# Patient Record
Sex: Male | Born: 1960 | Race: Black or African American | Hispanic: No | Marital: Married | Smoking: Never smoker
Health system: Southern US, Community
[De-identification: ages and names within clinical notes are randomized; demographics above are authoritative.]

## PROBLEM LIST (undated history)

## (undated) DIAGNOSIS — I10 Essential (primary) hypertension: Secondary | ICD-10-CM

---

## 1995-01-13 HISTORY — PX: KNEE SURGERY: SHX244

## 2001-05-02 ENCOUNTER — Encounter: Payer: Self-pay | Admitting: Internal Medicine

## 2001-05-02 ENCOUNTER — Encounter: Admission: RE | Admit: 2001-05-02 | Discharge: 2001-05-02 | Payer: Self-pay | Admitting: Internal Medicine

## 2001-06-29 ENCOUNTER — Encounter: Admission: RE | Admit: 2001-06-29 | Discharge: 2001-09-27 | Payer: Self-pay | Admitting: Internal Medicine

## 2011-09-23 ENCOUNTER — Other Ambulatory Visit: Payer: Self-pay

## 2011-09-23 ENCOUNTER — Encounter (HOSPITAL_COMMUNITY): Payer: Self-pay | Admitting: *Deleted

## 2011-09-23 ENCOUNTER — Emergency Department (HOSPITAL_COMMUNITY): Payer: Non-veteran care

## 2011-09-23 ENCOUNTER — Inpatient Hospital Stay (HOSPITAL_COMMUNITY)
Admission: EM | Admit: 2011-09-23 | Discharge: 2011-09-27 | DRG: 311 | Disposition: A | Discharge status: Home / Self Care | Payer: Non-veteran care | Attending: Internal Medicine | Admitting: Internal Medicine

## 2011-09-23 ENCOUNTER — Inpatient Hospital Stay (HOSPITAL_COMMUNITY): Payer: Non-veteran care

## 2011-09-23 DIAGNOSIS — Z79899 Other long term (current) drug therapy: Secondary | ICD-10-CM

## 2011-09-23 DIAGNOSIS — R079 Chest pain, unspecified: Secondary | ICD-10-CM

## 2011-09-23 DIAGNOSIS — G4440 Drug-induced headache, not elsewhere classified, not intractable: Secondary | ICD-10-CM | POA: Diagnosis not present

## 2011-09-23 DIAGNOSIS — T463X5A Adverse effect of coronary vasodilators, initial encounter: Secondary | ICD-10-CM | POA: Diagnosis not present

## 2011-09-23 DIAGNOSIS — R000 Tachycardia, unspecified: Secondary | ICD-10-CM | POA: Diagnosis present

## 2011-09-23 DIAGNOSIS — I10 Essential (primary) hypertension: Secondary | ICD-10-CM | POA: Diagnosis present

## 2011-09-23 DIAGNOSIS — R609 Edema, unspecified: Secondary | ICD-10-CM | POA: Diagnosis present

## 2011-09-23 DIAGNOSIS — R0989 Other specified symptoms and signs involving the circulatory and respiratory systems: Secondary | ICD-10-CM | POA: Diagnosis present

## 2011-09-23 DIAGNOSIS — Z8249 Family history of ischemic heart disease and other diseases of the circulatory system: Secondary | ICD-10-CM

## 2011-09-23 DIAGNOSIS — M25519 Pain in unspecified shoulder: Secondary | ICD-10-CM | POA: Diagnosis present

## 2011-09-23 DIAGNOSIS — I201 Angina pectoris with documented spasm: Principal | ICD-10-CM | POA: Diagnosis present

## 2011-09-23 DIAGNOSIS — I209 Angina pectoris, unspecified: Secondary | ICD-10-CM

## 2011-09-23 HISTORY — DX: Essential (primary) hypertension: I10

## 2011-09-23 LAB — CBC
Hemoglobin: 13 g/dL (ref 13.0–17.0)
MCHC: 32.7 g/dL (ref 30.0–36.0)
RDW: 14 % (ref 11.5–15.5)

## 2011-09-23 LAB — COMPREHENSIVE METABOLIC PANEL
ALT: 45 U/L (ref 0–53)
AST: 38 U/L — ABNORMAL HIGH (ref 0–37)
Albumin: 4.1 g/dL (ref 3.5–5.2)
Alkaline Phosphatase: 64 U/L (ref 39–117)
BUN: 11 mg/dL (ref 6–23)
CO2: 23 mEq/L (ref 19–32)
Calcium: 9.2 mg/dL (ref 8.4–10.5)
Chloride: 104 mEq/L (ref 96–112)
Creatinine, Ser: 0.97 mg/dL (ref 0.50–1.35)
GFR calc Af Amer: >90 mL/min (ref >90)
GFR calc non Af Amer: >90 mL/min (ref >90)
Glucose, Bld: 108 mg/dL — ABNORMAL HIGH (ref 70–99)
Potassium: 4 mEq/L (ref 3.5–5.1)
Sodium: 139 mEq/L (ref 135–145)
Total Bilirubin: 0.3 mg/dL (ref 0.3–1.2)
Total Protein: 7.4 g/dL (ref 6.0–8.3)

## 2011-09-23 LAB — LIPID PANEL
HDL: 82 mg/dL (ref >39)
LDL Cholesterol: 114 mg/dL — ABNORMAL HIGH (ref 0–99)
VLDL: 10 mg/dL (ref 0–40)

## 2011-09-23 LAB — PROTIME-INR
INR: 1.04 (ref 0.00–1.49)
Prothrombin Time: 13.8 seconds (ref 11.6–15.2)

## 2011-09-23 LAB — CREATININE, SERUM
Creatinine, Ser: 1.06 mg/dL (ref 0.50–1.35)
GFR calc non Af Amer: 80 mL/min — ABNORMAL LOW (ref >90)

## 2011-09-23 LAB — CBC WITH DIFFERENTIAL/PLATELET
Basophils Absolute: 0 10*3/uL (ref 0.0–0.1)
Basophils Relative: 1 % (ref 0–1)
Eosinophils Absolute: 0.1 10*3/uL (ref 0.0–0.7)
Eosinophils Relative: 1 % (ref 0–5)
HCT: 39.1 % (ref 39.0–52.0)
Hemoglobin: 13.1 g/dL (ref 13.0–17.0)
Lymphocytes Relative: 20 % (ref 12–46)
Lymphs Abs: 1.4 10*3/uL (ref 0.7–4.0)
MCH: 28.4 pg (ref 26.0–34.0)
MCHC: 33.5 g/dL (ref 30.0–36.0)
MCV: 84.6 fL (ref 78.0–100.0)
Monocytes Absolute: 0.5 10*3/uL (ref 0.1–1.0)
Monocytes Relative: 7 % (ref 3–12)
Neutro Abs: 5.2 10*3/uL (ref 1.7–7.7)
Neutrophils Relative %: 72 % (ref 43–77)
Platelets: 280 10*3/uL (ref 150–400)
RBC: 4.62 MIL/uL (ref 4.22–5.81)
RDW: 13.8 % (ref 11.5–15.5)
WBC: 7.2 10*3/uL (ref 4.0–10.5)

## 2011-09-23 LAB — TROPONIN I: Troponin I: <0.3 ng/mL (ref <0.30)

## 2011-09-23 LAB — D-DIMER, QUANTITATIVE: D-Dimer, Quant: 0.39 ug/mL-FEU (ref 0.00–0.48)

## 2011-09-23 MED ORDER — ONDANSETRON HCL 4 MG/2ML IJ SOLN
4.0000 mg | Freq: Once | INTRAMUSCULAR | Status: AC
  Administered 2011-09-23: 4 mg via INTRAVENOUS
  Filled 2011-09-23: qty 2

## 2011-09-23 MED ORDER — ASPIRIN 300 MG RE SUPP
300.0000 mg | RECTAL | Status: AC
  Filled 2011-09-23: qty 1

## 2011-09-23 MED ORDER — ASPIRIN 81 MG PO CHEW
324.0000 mg | CHEWABLE_TABLET | ORAL | Status: AC
  Administered 2011-09-23: 324 mg via ORAL
  Filled 2011-09-23: qty 4

## 2011-09-23 MED ORDER — GI COCKTAIL ~~LOC~~
30.0000 mL | Freq: Once | ORAL | Status: AC
  Administered 2011-09-23: 30 mL via ORAL
  Filled 2011-09-23: qty 30

## 2011-09-23 MED ORDER — HEPARIN SODIUM (PORCINE) 5000 UNIT/ML IJ SOLN
5000.0000 [IU] | Freq: Three times a day (TID) | INTRAMUSCULAR | Status: DC
  Administered 2011-09-24 – 2011-09-27 (×9): 5000 [IU] via SUBCUTANEOUS
  Filled 2011-09-23 (×14): qty 1

## 2011-09-23 MED ORDER — NITROGLYCERIN 0.4 MG SL SUBL
0.4000 mg | SUBLINGUAL_TABLET | SUBLINGUAL | Status: AC | PRN
  Administered 2011-09-23 (×3): 0.4 mg via SUBLINGUAL

## 2011-09-23 MED ORDER — IOHEXOL 350 MG/ML SOLN
100.0000 mL | Freq: Once | INTRAVENOUS | Status: AC | PRN
  Administered 2011-09-23: 100 mL via INTRAVENOUS

## 2011-09-23 MED ORDER — HYDROCODONE-ACETAMINOPHEN 5-325 MG PO TABS
1.0000 | ORAL_TABLET | Freq: Four times a day (QID) | ORAL | Status: DC | PRN
  Administered 2011-09-26: 2 via ORAL
  Filled 2011-09-23: qty 1
  Filled 2011-09-23: qty 2

## 2011-09-23 MED ORDER — ACETAMINOPHEN 325 MG PO TABS
650.0000 mg | ORAL_TABLET | ORAL | Status: DC | PRN
  Administered 2011-09-26: 650 mg via ORAL
  Filled 2011-09-23: qty 2

## 2011-09-23 MED ORDER — NITROGLYCERIN 0.4 MG SL SUBL
0.4000 mg | SUBLINGUAL_TABLET | SUBLINGUAL | Status: DC | PRN
  Administered 2011-09-23 – 2011-09-25 (×7): 0.4 mg via SUBLINGUAL
  Filled 2011-09-23 (×7): qty 25

## 2011-09-23 MED ORDER — ASPIRIN EC 81 MG PO TBEC
81.0000 mg | DELAYED_RELEASE_TABLET | Freq: Every day | ORAL | Status: DC
  Administered 2011-09-24 – 2011-09-27 (×4): 81 mg via ORAL
  Filled 2011-09-23 (×4): qty 1

## 2011-09-23 MED ORDER — MORPHINE SULFATE 10 MG/ML IJ SOLN
10.0000 mg | Freq: Once | INTRAMUSCULAR | Status: AC
  Administered 2011-09-23: 10 mg via INTRAMUSCULAR
  Filled 2011-09-23: qty 1

## 2011-09-23 MED ORDER — ONDANSETRON HCL 4 MG/2ML IJ SOLN
4.0000 mg | Freq: Four times a day (QID) | INTRAMUSCULAR | Status: DC | PRN
  Administered 2011-09-23 – 2011-09-27 (×2): 4 mg via INTRAVENOUS
  Filled 2011-09-23 (×2): qty 2

## 2011-09-23 NOTE — Progress Notes (Signed)
*  PRELIMINARY RESULTS* Vascular Ultrasound Lower extremity venous duplex has been completed.  Preliminary findings: bilaterally no evidence of DVT or baker's cyst.  Farrel Demark, RDMS, RVT  09/23/2011, 4:22 PM

## 2011-09-23 NOTE — ED Notes (Signed)
Report called to 2000 

## 2011-09-23 NOTE — ED Notes (Signed)
Patient is resting comfortably. 

## 2011-09-23 NOTE — ED Notes (Signed)
Pt was sitting a desk and developed midsternal chest pain with no radiation, no short of breath, does report diaphoresis

## 2011-09-23 NOTE — H&P (Signed)
Date: 09/23/2011               Patient Name:  Lance Mills MRN: [id number]  DOB: 1960-01-28 Age / Sex: 51 y.o., male   PCP: DEFAULT,PROVIDER              Medical Service: Internal Medicine Teaching Service              Attending Physician: Dr. Rocco Serene, MD    First Contact: Dr. Elenor Legato Pager: [phone]  Second Contact: Dr. Kristie Cowman Pager: [phone]            After Hours (After 5p/  First Contact Pager: [phone]  weekends / holidays): Second Contact Pager: [phone]     Chief Complaint: Chest pain  History of Present Illness: Patient is a 51 y.o. male with a PMHx of HTN, who presents to [facility] for evaluation of chest pain. The pain began at 930AM on the day of admission. It began suddenly as patient was standing talking with friends. Pain was located in the center of the chest, was stabbing in quality, with radiation of pressure-like pain to the back. Patient states the pain had been constant since that time and did not resolve until he received NTG in the ED, which he states temporarily resolved the pain, but after which it returned. He subsequently received morphine in the ED, which he states resolved his chest pain. Patient states he felt the pain was worse when his heart beats. He denies any other alleviating or exacerbating factors. No positional change in pain. States that his pain was accompanied by diaphoresis, but denies any SOB. Denies palpitations or leg swelling. Denies ever having chest pain in the past prior to this event. He denies heartburn or history of GERD.  Review of Systems: Constitutional:  denies fever, chills. Admits to diaphoresis.  Respiratory: denies SOB, wheezing.  Cardiovascular: admits to chest pain. Denies palpitations, leg swelling.  Gastrointestinal: admits to nausea and vomiting (only in ED, following morphine). Denies abdominal pain, heartburn, diarrhea, constipation, blood in stool.  Genitourinary: denies dysuria, urgency,  frequency, hematuria, flank pain and difficulty urinating.  Skin: denies pallor, rash and wound.    Current Outpatient Medications: Medication Sig Dispense Refill  . naproxen (NAPROSYN) 500 MG tablet Take 500 mg by mouth 2 (two) times daily with a meal.      . traMADol (ULTRAM) 50 MG tablet Take 50 mg by mouth every 6 (six) hours as needed. For pain       Allergies: Allergies  Allergen Reactions  . Amoxicillin   . Biaxin (Clarithromycin)   . Eggs Or Egg-Derived Products   . Other     Flu vaccine  . Penicillins      Past Medical History: History reviewed. No pertinent past medical history.  Past Surgical History: Past Surgical History  Procedure Date  . Knee surgery     Family History: No family history on file.  Social History: History   Social History  . Marital Status: Married    Spouse Name: N/A    Number of Children: N/A  . Years of Education: N/A   Occupational History  . Not on file.   Social History Main Topics  . Smoking status: Never Smoker   . Smokeless tobacco: Not on file  . Alcohol Use: No  . Drug Use: Yes    Special: Marijuana  . Sexually Active:    Other Topics Concern  . Not on file   Social  History Narrative  . No narrative on file     Vital Signs: Blood pressure 137/87, pulse 67, temperature 98.4 F (36.9 C), temperature source Oral, resp. rate 22, SpO2 96.00%.  Physical Exam: General: Vital signs reviewed and noted. Well-developed, well-nourished, in no acute distress; alert, appropriate and cooperative throughout examination.  Head: Normocephalic, atraumatic.  Eyes: PERRL, EOMI, No signs of anemia or jaundice.  Nose: Mucous membranes moist, not inflammed, nonerythematous.  Throat: Oropharynx nonerythematous, no exudate appreciated.   Neck: No deformities, masses, or tenderness noted.  Lungs:  Normal respiratory effort. Bibasilar rales.  Heart: RRR. S1 and S2 normal without gallop, murmur, or rubs.  Abdomen:  BS normoactive.  Soft, Nondistended, non-tender.  Extremities: Trace pretibial edema.  Neurologic: A&O X3, CN II - XII are grossly intact. Motor strength is 5/5 in the all 4 extremities, Sensations intact to light touch, Cerebellar signs negative.  Skin: No visible rashes, scars.   Lab results: Basic Metabolic Panel:  Basename 09/23/11 1103  NA 139  K 4.0  CL 104  CO2 23  GLUCOSE 108*  BUN 11  CREATININE 0.97  CALCIUM 9.2  MG --  PHOS --   Liver Function Tests:  Basename 09/23/11 1103  AST 38*  ALT 45  ALKPHOS 64  BILITOT 0.3  PROT 7.4  ALBUMIN 4.1   CBC:  Basename 09/23/11 1103  WBC 7.2  NEUTROABS 5.2  HGB 13.1  HCT 39.1  MCV 84.6  PLT 280   Cardiac Enzymes:  Basename 09/23/11 1318 09/23/11 1103  CKTOTAL -- --  CKMB -- --  CKMBINDEX -- --  TROPONINI <0.30 <0.30   D-Dimer:  Basename 09/23/11 1455  DDIMER 0.39   Imaging results:  Dg Chest 2 View  09/23/2011  *RADIOLOGY REPORT*  Clinical Data: Acute onset mid chest pain.  History of hypertension.  CHEST - 2 VIEW  Comparison: None.  Findings: Cardiac silhouette upper normal in size.  Thoracic aorta mildly tortuous.  Hilar and mediastinal contours otherwise unremarkable.  Lungs clear.  Bronchovascular markings normal. Pulmonary vascularity normal.  No pneumothorax.  No pleural effusions.  Visualized bony thorax intact.  IMPRESSION: No acute cardiopulmonary disease.   Original Report Authenticated By: Arnell Sieving, M.D.      Other results:  EKG (09/23/2011), 1300 - Normal Sinus Rhythm, regular rate of approximately 70 bpm, left axis, ST segments: normal. S1Q3T3 findings concerning for acute right heart strain. 1st degree AV block.     Assessment & Plan: Pt is a 51 y.o. yo male with a PMHx of HTN who was admitted on 09/23/2011 with symptoms of chest pain.   Chest pain - Etiology unclear. No ST changes on EKG, and troponin in ED at 1300 was negative, making ACS less likely. PE was a concern given patient's S1Q3T3 on  EKG, but CTA was negative for PE. With radiation of pain to back, aortic pathology is of concern. Aortic aneurysm ruled out by negative CTA. Aortic dissection not able to be completely r/o by CTA with PE protocol, but patient is hemodynamically stable and no obvious abnormalities were present on CTA. Patient denies sick contacts, has no fever or leukocytosis, and CXR unrevealing of infiltrate, so PNA unlikely. MSK pain is possible, however patient was not TTP and sudden onset with diaphoresis is less consistent with presentation of costosternal syndrome (e.g. costochondritis). Interventions at this time will be directed towards further excluding acute and concerning etiologies of chest pain, and towards treating the patient symptomatically. - admit to floor on  tele - monitor vitals - cycle CE x 3 - repeat EKG in AM - NTG PRN - daily ASA - GI cocktail - morphine   HTN - patient was on lisinopril previously, however he states his physician discontinued this medication as hypertension was thought to have resolved. BPs since admission as high as 160/100. Patient will need institution of anti-hypertensive regimen prior to discharge.  - consider initiating monotherapy versus dual therapy  DVT PPX - heparin    Signed: Elenor Legato, M.D. PGY-I, Internal Medicine Resident Pager: [phone] (7AM-5PM) 09/23/2011, 4:08 PM

## 2011-09-23 NOTE — ED Provider Notes (Signed)
History     CSN: [id number]  Arrival date & time 09/23/11  1037   First MD Initiated Contact with Patient 09/23/11 1126      Chief Complaint  Patient presents with  . 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 Describes the pain as a feeling of someone stabbing him in the chest. At first the pain was 7/10, but as he's been sitting in a hospital bed is decreased to a 5/10. He has not tried to take anything for his pain. Denies any radiation to his arm, jaw or back. He did not eat breakfast this morning. Denies any shortness of breath nausea, lightheadedness, dizziness, leg swelling, chest tenderness. He has a mild headache. He has never had chest pain like this before. Denies any history of indigestion or heart disease. His grandfather passed away at the age of 22 of a heart attack. He is a nonsmoker.  Patient is a 51 y.o. male presenting with chest pain. The history is provided by the patient and the spouse.  Chest Pain Pertinent negatives for primary symptoms include no shortness of breath, no cough, no nausea, no vomiting and no dizziness.  Pertinent negatives for associated symptoms include no diaphoresis.     History reviewed. No pertinent past medical history.  Past Surgical History  Procedure Date  . Knee surgery     No family history on file.  History  Substance Use Topics  . Smoking status: Never Smoker   . Smokeless tobacco: Not on file  . Alcohol Use: No      Review of Systems  Constitutional: Negative for diaphoresis, activity change and appetite change.  Respiratory: Negative for cough, chest tightness and shortness of breath.   Cardiovascular: Positive for chest pain. Negative for leg swelling.  Gastrointestinal: Negative for nausea and vomiting.    Musculoskeletal: Negative for back pain.  Skin: Negative for color change.  Neurological: Positive for headaches. Negative for dizziness and light-headedness.  Psychiatric/Behavioral: Negative for confusion.    Allergies  Amoxicillin; Biaxin; Eggs or egg-derived products; Other; and Penicillins  Home Medications   Current Outpatient Rx  Name Route Sig Dispense Refill  . NAPROXEN 500 MG PO TABS Oral Take 500 mg by mouth 2 (two) times daily with a meal.    . TRAMADOL HCL 50 MG PO TABS Oral Take 50 mg by mouth every 6 (six) hours as needed. For pain      BP 159/109  Pulse 80  Temp 98.4 F (36.9 C) (Oral)  Resp 18  SpO2 99%  Physical Exam  Nursing note and vitals reviewed. Constitutional: He is oriented to person, place, and time. He appears well-developed and well-nourished. No distress.  HENT:  Head: Normocephalic and atraumatic.  Mouth/Throat: Oropharynx is clear and moist.  Eyes: Conjunctivae normal are normal.  Neck: Normal range of motion. Neck supple. No JVD present.  Cardiovascular: Normal rate, regular rhythm, normal heart sounds and intact distal pulses.        Trace pitting edema lower extremities b/l  Pulmonary/Chest: Effort normal and breath sounds normal. He has no wheezes. He has no rales. He exhibits no tenderness.  Abdominal: Soft. Bowel sounds are normal. There is no tenderness.  Musculoskeletal: Normal range of motion.  Neurological: He is alert and oriented to person, place, and time.  Skin: Skin is warm and dry. He is not diaphoretic.  Psychiatric: He has a normal mood and affect. His behavior is normal.    ED Course  Procedures (including critical care time)   Labs Reviewed  CBC WITH DIFFERENTIAL  TROPONIN I  COMPREHENSIVE METABOLIC PANEL   Results for orders placed during the hospital encounter of 09/23/11  TROPONIN I      Component Value Range   Troponin I <0.30  <0.30 ng/mL  COMPREHENSIVE METABOLIC PANEL      Component Value Range    Sodium 139  135 - 145 mEq/L   Potassium 4.0  3.5 - 5.1 mEq/L   Chloride 104  96 - 112 mEq/L   CO2 23  19 - 32 mEq/L   Glucose, Bld 108 (*) 70 - 99 mg/dL   BUN 11  6 - 23 mg/dL   Creatinine, Ser 1.61  0.50 - 1.35 mg/dL   Calcium 9.2  8.4 - 09.6 mg/dL   Total Protein 7.4  6.0 - 8.3 g/dL   Albumin 4.1  3.5 - 5.2 g/dL   AST 38 (*) 0 - 37 U/L   ALT 45  0 - 53 U/L   Alkaline Phosphatase 64  39 - 117 U/L   Total Bilirubin 0.3  0.3 - 1.2 mg/dL   GFR calc non Af Amer >90  >90 mL/min   GFR calc Af Amer >90  >90 mL/min  CBC WITH DIFFERENTIAL      Component Value Range   WBC 7.2  4.0 - 10.5 K/uL   RBC 4.62  4.22 - 5.81 MIL/uL   Hemoglobin 13.1  13.0 - 17.0 g/dL   HCT 04.5  40.9 - 81.1 %   MCV 84.6  78.0 - 100.0 fL   MCH 28.4  26.0 - 34.0 pg   MCHC 33.5  30.0 - 36.0 g/dL   RDW 91.4  78.2 - 95.6 %   Platelets 280  150 - 400 K/uL   Neutrophils Relative 72  43 - 77 %   Neutro Abs 5.2  1.7 - 7.7 K/uL   Lymphocytes Relative 20  12 - 46 %   Lymphs Abs 1.4  0.7 - 4.0 K/uL   Monocytes Relative 7  3 - 12 %   Monocytes Absolute 0.5  0.1 - 1.0 K/uL   Eosinophils Relative 1  0 - 5 %   Eosinophils Absolute 0.1  0.0 - 0.7 K/uL   Basophils Relative 1  0 - 1 %   Basophils Absolute 0.0  0.0 - 0.1 K/uL    Dg Chest 2 View  09/23/2011  *RADIOLOGY REPORT*  Clinical Data: Acute onset mid chest pain.  History of hypertension.  CHEST - 2 VIEW  Comparison: None.  Findings: Cardiac silhouette upper normal in size.  Thoracic aorta mildly tortuous.  Hilar and mediastinal contours otherwise unremarkable.  Lungs clear.  Bronchovascular markings normal. Pulmonary vascularity normal.  No pneumothorax.  No pleural effusions.  Visualized bony thorax intact.  IMPRESSION: No acute cardiopulmonary disease.   Original Report Authenticated By: Arnell Sieving, M.D.     Date: 09/23/2011  Rate: 80  Rhythm: normal sinus rhythm  QRS Axis: left  Intervals: PR prolonged  ST/T Wave abnormalities: normal  Conduction  Disutrbances:none  Narrative Interpretation: no stemi  Old EKG Reviewed: none available  Another EKG obtained due to active chest pain  Date: 09/23/2011  Rate: 72  Rhythm: normal sinus rhythm  QRS Axis: normal  Intervals: PR prolonged  ST/T Wave abnormalities: normal  Conduction Disutrbances:first-degree A-V block   Narrative  Interpretation: no stemi  Old EKG Reviewed: no significant changes    No diagnosis found.    MDM  51 year old male without any significant past medical history presenting with sudden onset chest pain today. I was initially suspecting acid reflux, however patient failed GI cocktail. He now states the pain is worse and radiating to his back. I will give sublingual nitroglycerin and reevaluate. .1:10 PM Patient states his pain was relieved by nitroglycerin for a few seconds and then returned. Case discussed with Dr. Weldon Inches who also evaluated patient. Patient mentioned to Dr. Weldon Inches he was diaphoretic at the onset of chest pain. Due to the new onset unstable angina, we feel the next best step is to admit the patient.      Trevor Mace, PA-C 09/25/11 1610  Trevor Mace, PA-C 09/25/11 [phone]

## 2011-09-23 NOTE — ED Provider Notes (Signed)
Medical screening examination/treatment/procedure(s) were conducted as a shared visit with non-physician practitioner(s) and myself.  I personally evaluated the patient during the encounter 51 year old, male, with a history of hypertension, but no other past medical history.  He complains of sudden onset of chest pressure, and back pressure, associated with diaphoresis.  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  He denied nausea, vomiting, or shortness of breath.  He is never had this before.  He does not smoke.  One of his grandfathers had a myocardial infarction, but no closer relatives have had a history of coronary artery disease.  He was given nitroglycerin, and he had transient decrease in the severity of the symptoms, but his chest.  Pain, has returned.  His EKG, and cardiac enzymes do not show signs of ACS.  However, it is her greatest concern.  We will admit him to the hospital for further evaluation and treatment   Cheri Guppy, MD 09/23/11 1338

## 2011-09-24 DIAGNOSIS — I10 Essential (primary) hypertension: Secondary | ICD-10-CM

## 2011-09-24 DIAGNOSIS — R079 Chest pain, unspecified: Secondary | ICD-10-CM

## 2011-09-24 LAB — BASIC METABOLIC PANEL
CO2: 27 mEq/L (ref 19–32)
Calcium: 9.2 mg/dL (ref 8.4–10.5)
Potassium: 3.9 mEq/L (ref 3.5–5.1)
Sodium: 139 mEq/L (ref 135–145)

## 2011-09-24 LAB — CBC
MCH: 28.2 pg (ref 26.0–34.0)
Platelets: 273 10*3/uL (ref 150–400)
RBC: 4.65 MIL/uL (ref 4.22–5.81)
WBC: 7.2 10*3/uL (ref 4.0–10.5)

## 2011-09-24 LAB — TROPONIN I: Troponin I: <0.3 ng/mL (ref <0.30)

## 2011-09-24 LAB — CK TOTAL AND CKMB (NOT AT ARMC)
CK, MB: 3.6 ng/mL (ref 0.3–4.0)
Relative Index: 0.6 (ref 0.0–2.5)
Total CK: 624 U/L — ABNORMAL HIGH (ref 7–232)
Total CK: 494 U/L — ABNORMAL HIGH (ref 7–232)

## 2011-09-24 LAB — HEMOGLOBIN A1C: Hgb A1c MFr Bld: 5.8 % — ABNORMAL HIGH (ref <5.7)

## 2011-09-24 NOTE — Progress Notes (Signed)
Pt complained of chest pain.  Two sublingual nitroglycerin with relief.  Pt BP elevated 152/100, 149/89 and 140/81 with heart rate 67-78 throughout pain episode. EKG performed per verbal order. Pt pain free.  MD called and aware. Pt remains NPO awaiting stress test. Will continue to monitor. Thomas Hoff

## 2011-09-24 NOTE — H&P (Signed)
Internal Medicine Attending Admission Note Date: 09/24/2011  Patient name: Lance Mills Medical record number: [id number] Date of birth: February 20, 1960 Age: 51 y.o. Gender: male  I saw and evaluated the patient. I reviewed the resident's note and I agree with the resident's findings and plan as documented in the resident's note.  Chief Complaint(s): Chest pain  History - key components related to admission:  Lance Mills is a 51 year old man with a history of hypertension who presents with the acute onset of chest pain. 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 The pain radiated to the back but was not associated with shortness of breath. He eventually developed some nausea with pain but that wasn't until he arrived to the emergency department. After the pain developed, he walked down the hall to his office and sat down and it was improved. A few minutes later the pain returned and was even more severe. He therefore presented to the emergency department for further evaluation. He is active and rides his bike and walks. He was involved in a sprinting race earlier in the week with a younger gentleman and had no chest pain. In fact, he's never had previous chest pain. He also denies a history of acid reflux, a brackish taste in the back of his mouth, or any recent unusual activities, exercises, hobbies, or travel. He is without other complaints at this time.  It should be noted that his birthday was on Tuesday and he went out with friends to celebrate. He admits to drinking heavily and also using marijuana.  He does not have a history of diabetes, has never smoked cigarettes although smokes an occasional marijuana joint, does not have known hyperlipidemia, and does have a family history of premature coronary  artery disease and a second degree relative. His maternal grandfather died of an MI at the age of 38 and his maternal side cousin died of a myocardial infarction at the age of 34.  Physical Exam - key components related to admission:  Filed Vitals:   09/24/11 0532 09/24/11 0927 09/24/11 0956 09/24/11 1001  BP: 159/96 152/100 149/89 140/81  Pulse: 64 67 78 68  Temp: 97.9 F (36.6 C)     TempSrc: Oral     Resp: 18 18    SpO2: 99% 100% 98% 100%   General: Well-developed, well-nourished, man lying comfortably in bed in no acute distress. Lungs: Bibasilar inspiratory crackles without wheezes, rhonchi, or rales. Heart: Regular rate and rhythm with a 1/6 early systolic murmur at the left upper sternal border without rubs or gallops. Abdomen: Soft, nontender, active bowel sounds without masses or hepatosplenomegaly. Extremities: Without clubbing, cyanosis, or edema. Chest wall: Non-tender to palpation over the anterior chest.  Lab results:  Basic Metabolic Panel:  Basename 09/24/11 0640 09/23/11 1809 09/23/11 1103  NA 139 -- 139  K 3.9 -- 4.0  CL 101 -- 104  CO2 27 -- 23  GLUCOSE 89 -- 108*  BUN 13 -- 11  CREATININE 1.10 1.06 --  CALCIUM 9.2 -- 9.2  MG -- -- --  PHOS -- -- --   Liver Function Tests:  Basename 09/23/11 1103  AST 38*  ALT 45  ALKPHOS 64  BILITOT 0.3  PROT 7.4  ALBUMIN 4.1   CBC:  Basename 09/24/11 0640 09/23/11 1809 09/23/11 1103  WBC 7.2 7.7 --  NEUTROABS -- -- 5.2  HGB 13.1 13.0 --  HCT 39.6 39.7 --  MCV 85.2 85.4 --  PLT 273 257 --   Cardiac Enzymes:  Basename 09/24/11 0640 09/24/11 0031 09/23/11 1909  CKTOTAL 566* 624* --  CKMB 3.6 4.1* --  CKMBINDEX -- -- --  TROPONINI <0.30 <0.30 <0.30   D-Dimer:  Basename 09/23/11 1455  DDIMER 0.39   Hemoglobin A1C:  Basename 09/23/11 1809  HGBA1C 5.8*   Fasting Lipid Panel:  Basename 09/23/11 1809  CHOL 206*  HDL 82  LDLCALC 114*  TRIG 50  CHOLHDL 2.5  LDLDIRECT --    Coagulation:  Basename 09/23/11 1809  INR 1.04   Imaging results:  Dg Chest 2 View  09/23/2011  *RADIOLOGY REPORT*  Clinical Data: Acute onset mid chest pain.  History of hypertension.  CHEST - 2 VIEW  Comparison: None.  Findings: Cardiac silhouette upper normal in size.  Thoracic aorta mildly tortuous.  Hilar and mediastinal contours otherwise unremarkable.  Lungs clear.  Bronchovascular markings normal. Pulmonary vascularity normal.  No pneumothorax.  No pleural effusions.  Visualized bony thorax intact.  IMPRESSION: No acute cardiopulmonary disease.   Original Report Authenticated By: Arnell Sieving, M.D.    Ct Angio Chest Pe W/cm &/or Wo Cm  09/23/2011  *RADIOLOGY REPORT*  Clinical Data: Chest pain radiating to back.  Question pulmonary embolus.  Rule out aortic abnormality.  CT ANGIOGRAPHY CHEST  Technique:  Multidetector CT imaging of the chest using the standard protocol during bolus administration of intravenous contrast. Multiplanar reconstructed images including MIPs were obtained and reviewed to evaluate the vascular anatomy.  Contrast: OMNIPAQUE IOHEXOL 350 MG/ML SOLN  Comparison: None.  Findings: No filling defects in the pulmonary arteries to suggest pulmonary emboli.  Aorta is normal caliber.  The study was performed with pulmonary embolus bolus timing and the aorta is not opacified with contrast.  Cannot exclude dissection.  Heart is mildly enlarged. No mediastinal, hilar, or axillary adenopathy. Visualized thyroid and chest wall soft tissues unremarkable.  Minimal dependent atelectasis in the left lung.  Right lung is clear.  No effusions.  No acute bony abnormality.  Mild degenerative changes in the thoracic spine.  IMPRESSION: No evidence of pulmonary embolus. Timing of the study was not performed for adequate opacification and evaluation of the aorta. No aneurysm.  Cannot exclude dissection.  Borderline heart size.   Original Report Authenticated By: Cyndie Chime, M.D.     Other results:  EKG: Normal sinus rhythm, first degree AV block, no significant Q waves or LVH, normal axis, inferior limb lead III and aVF T wave inversion in a strain pattern. No comparisons available.  Assessment & Plan by Problem:  Mr. Barrell is a 51 year old man with a history of hypertension and marijuana abuse who presents with the acute onset of atypical chest pain. His only known risk factor is the hypertension, although he has second degree relatives with premature coronary artery disease. He has ruled out for myocardial infarction with serial enzymes. His elevated CK may be related to the alcohol binge during his birthday celebration the other day. Nonetheless, given the hypertension and family history, it is not unreasonable to risk stratify Mr. Portal from a cardiac standpoint.  1) Chest pain: Mr. Catala has been ruled out for myocardial infarction we will perform an exercise treadmill examination to risk stratify him. Although he has a knee replacement, he feels he is able to participate fully on the treadmill  stress test. Further evaluation and therapy are pending the results of this exam.  2) Hypertension: After the treadmill stress test we will consider initiating pharmacologic therapy for his hypertension. If the stress test is negative he may benefit from a medication such as hydrochlorothiazide. If the stress test is positive, he may benefit from the addition of an antianginal including a beta blocker.  3) Disposition: Depending on the results of the exercise treadmill Mr. Knebel may be ready for discharge if it is negative. If it is positive further evaluation will be necessary as an inpatient.

## 2011-09-24 NOTE — Progress Notes (Signed)
Subjective: Patient continues to complain of chest pain this AM, unchanged in quality (stabbing in mid-chest, with radiating pressure sensation to back). 4/10 in severity (improved). Improved with NTG within 1 minute, with relief ~1hr before pain recurs. Denies SOB. States he has had nausea/vomiting after PO intake, which he believes is a continuing effect from having received morphine yesterday evening.  Objective: Vital signs in last 24 hours: Filed Vitals:   09/24/11 0532 09/24/11 0927 09/24/11 0956 09/24/11 1001  BP: 159/96 152/100 149/89 140/81  Pulse: 64 67 78 68  Temp: 97.9 F (36.6 C)     TempSrc: Oral     Resp: 18 18    SpO2: 99% 100% 98% 100%   Weight change:   Intake/Output Summary (Last 24 hours) at 09/24/11 1238 Last data filed at 09/23/11 2253  Gross per 24 hour  Intake    720 ml  Output      0 ml  Net    720 ml   Physical Exam: General: Awake and alert; NAD CV: Regular rate and rhythm, normal S1/S2 Resp: Bibasilar rales, otherwise CTA. GI/Abd: Soft, non-tender, non-distended; normoactive bowel sounds Ext: trace edema, no clubbing or cyanosis Skin: Warm, dry, intact   Lab Results: Basic Metabolic Panel:  Lab 09/24/11 1610 09/23/11 1809 09/23/11 1103  NA 139 -- 139  K 3.9 -- 4.0  CL 101 -- 104  CO2 27 -- 23  GLUCOSE 89 -- 108*  BUN 13 -- 11  CREATININE 1.10 1.06 --  CALCIUM 9.2 -- 9.2  MG -- -- --  PHOS -- -- --   Liver Function Tests:  Lab 09/23/11 1103  AST 38*  ALT 45  ALKPHOS 64  BILITOT 0.3  PROT 7.4  ALBUMIN 4.1   CBC:  Lab 09/24/11 0640 09/23/11 1809 09/23/11 1103  WBC 7.2 7.7 --  NEUTROABS -- -- 5.2  HGB 13.1 13.0 --  HCT 39.6 39.7 --  MCV 85.2 85.4 --  PLT 273 257 --   Cardiac Enzymes:  Lab 09/24/11 0640 09/24/11 0031 09/23/11 1909  CKTOTAL 566* 624* --  CKMB 3.6 4.1* --  CKMBINDEX -- -- --  TROPONINI <0.30 <0.30 <0.30   D-Dimer:  Lab 09/23/11 1455  DDIMER 0.39   Hemoglobin A1C:  Lab 09/23/11 1809  HGBA1C 5.8*     Fasting Lipid Panel:  Lab 09/23/11 1809  CHOL 206*  HDL 82  LDLCALC 114*  TRIG 50  CHOLHDL 2.5  LDLDIRECT --   Coagulation:  Lab 09/23/11 1809  LABPROT 13.8  INR 1.04   Studies/Results: Dg Chest 2 View  09/23/2011  *RADIOLOGY REPORT*  Clinical Data: Acute onset mid chest pain.  History of hypertension.  CHEST - 2 VIEW  Comparison: None.  Findings: Cardiac silhouette upper normal in size.  Thoracic aorta mildly tortuous.  Hilar and mediastinal contours otherwise unremarkable.  Lungs clear.  Bronchovascular markings normal. Pulmonary vascularity normal.  No pneumothorax.  No pleural effusions.  Visualized bony thorax intact.  IMPRESSION: No acute cardiopulmonary disease.   Original Report Authenticated By: Arnell Sieving, M.D.    Ct Angio Chest Pe W/cm &/or Wo Cm  09/23/2011  *RADIOLOGY REPORT*  Clinical Data: Chest pain radiating to back.  Question pulmonary embolus.  Rule out aortic abnormality.  CT ANGIOGRAPHY CHEST  Technique:  Multidetector CT imaging of the chest using the standard protocol during bolus administration of intravenous contrast. Multiplanar reconstructed images including MIPs were obtained and reviewed to evaluate the vascular anatomy.  Contrast: OMNIPAQUE IOHEXOL  350 MG/ML SOLN  Comparison: None.  Findings: No filling defects in the pulmonary arteries to suggest pulmonary emboli.  Aorta is normal caliber.  The study was performed with pulmonary embolus bolus timing and the aorta is not opacified with contrast.  Cannot exclude dissection.  Heart is mildly enlarged. No mediastinal, hilar, or axillary adenopathy. Visualized thyroid and chest wall soft tissues unremarkable.  Minimal dependent atelectasis in the left lung.  Right lung is clear.  No effusions.  No acute bony abnormality.  Mild degenerative changes in the thoracic spine.  IMPRESSION: No evidence of pulmonary embolus. Timing of the study was not performed for adequate opacification and evaluation of the  aorta. No aneurysm.  Cannot exclude dissection.  Borderline heart size.   Original Report Authenticated By: Cyndie Chime, M.D.    Medications: I have reviewed the patient's current medications. Scheduled Meds:   . aspirin  324 mg Oral NOW   Or  . aspirin  300 mg Rectal NOW  . aspirin EC  81 mg Oral Daily  . heparin  5,000 Units Subcutaneous Q8H  .  morphine injection  10 mg Intramuscular Once  . ondansetron (ZOFRAN) IV  4 mg Intravenous Once   Continuous Infusions:  PRN Meds:.acetaminophen, HYDROcodone-acetaminophen, iohexol, nitroGLYCERIN, nitroGLYCERIN, ondansetron (ZOFRAN) IV Assessment/Plan: Chest pain - ACS ruled out by trop neg x 3 (CK mildly elevated, but unlikely to be related to ACS given negative troponins). EKG revealed pattern concerning for PE, however CTA was negative. Chest pain decreased but persisting this AM, with temporary responsiveness to NTG (responds within 1 minute, with relief ~1hr). 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 LDL = 114, which without evidence of CAD is already at goal without intervention given history. We will continue to investigate possible cardiac etiology, with outpatient follow-up for the issue if workup is negative.  - AM EKG given recurring CP - exercise stress test  HTN - BP mildly elevated during course thus far. Will likely institute anti-HTN therapy following exercise stress test. Patient did well with lisinopril until 05/2011 (with only mild hypotension), but had adverse reaction to other agents in more remote past.  - defer decision on anti-HTN regimen until results of stress ECG have returned, as results will dictate management decisions  Dispo - possible discharge if stress ECG is negative, with [facility].   LOS: 1 day   Elfredia Nevins 09/24/2011, 12:38 PM

## 2011-09-24 NOTE — Progress Notes (Signed)
Pt complained of chest pain more towards right side.  Pt states pain is shooting.  Pt given two sublingual NTG with relief.  EKG completed and in chart. Pt continues to have elevated BP and states he feels heat pounding. Heart rate remained stable 70-78 . BP 156/105, 144/110, and 137/94 before and after nitroglycerin.  Pt is currently pain free.  Will continue to monitor. Thomas Hoff

## 2011-09-24 NOTE — Progress Notes (Signed)
Pt complained of 5/10 chest pain. Pt given 1 sublingual nitro, V/S check and EKG obtained. EKG shows NSR with 1st degree heart block. Pts VSS. After receiving 1 nitro tablet pt states he is pain free. Pt told to call pt if he develops any more chest pain. Pt in bed with call bell in reach. Will continue to monitor and assess for chest pain. Steva Colder [facility] 09/23/2011

## 2011-09-25 DIAGNOSIS — R079 Chest pain, unspecified: Secondary | ICD-10-CM

## 2011-09-25 MED ORDER — HYDROCHLOROTHIAZIDE 25 MG PO TABS
25.0000 mg | ORAL_TABLET | Freq: Every day | ORAL | Status: DC
  Administered 2011-09-25 – 2011-09-27 (×3): 25 mg via ORAL
  Filled 2011-09-25 (×4): qty 1

## 2011-09-25 NOTE — Progress Notes (Addendum)
Pt had episode of chest pain 8/10 to 0/10.  Two sublingual nitro given. BP is documented in epic.  Pt given O2 to wear at 2 liters. sats 100 RA. Chest pain free at this time.  EKG in chart. Lance Mills

## 2011-09-25 NOTE — Progress Notes (Signed)
Subjective: Feeling better today. Denies any chest pain overnight, and states he last had pain at approximately 8PM yesterday evening which responded to NTG.   Patient denies any chest pain this AM. No SOB. Denies other complaints.   Objective: Vital signs in last 24 hours: Filed Vitals:   09/24/11 1351 09/24/11 1947 09/24/11 2111 09/25/11 0503  BP: 136/82 171/100  148/106  Pulse: 71 72  73  Temp: 98.2 F (36.8 C) 98.2 F (36.8 C)  98.6 F (37 C)  TempSrc: Oral Oral  Oral  Resp: 18 18  18   Height:  6\' 1"  (1.854 m)    Weight:   202 lb (91.627 kg)   SpO2: 100% 99%  100%   Weight change:   Intake/Output Summary (Last 24 hours) at 09/25/11 1005 Last data filed at 09/25/11 0505  Gross per 24 hour  Intake      0 ml  Output      3 ml  Net     -3 ml   Physical Exam: General: Awake and alert; NAD CV: Regular rate and rhythm; no murmurs, rubs, or gallops Resp: Clear to auscultation bilaterally; no wheezes,  no rales (interval improvement), or rhonchi GI/Abd: Soft, non-tender, non-distended; normoactive bowel sounds Ext: 2+ pulses in all extremities; no edema (interval improvement), clubbing, or cyanosis Skin: Warm, dry, intact   Lab Results: Basic Metabolic Panel:  Lab 09/24/11 1610 09/23/11 1809 09/23/11 1103  NA 139 -- 139  K 3.9 -- 4.0  CL 101 -- 104  CO2 27 -- 23  GLUCOSE 89 -- 108*  BUN 13 -- 11  CREATININE 1.10 1.06 --  CALCIUM 9.2 -- 9.2  MG -- -- --  PHOS -- -- --   Liver Function Tests:  Lab 09/23/11 1103  AST 38*  ALT 45  ALKPHOS 64  BILITOT 0.3  PROT 7.4  ALBUMIN 4.1   CBC:  Lab 09/24/11 0640 09/23/11 1809 09/23/11 1103  WBC 7.2 7.7 --  NEUTROABS -- -- 5.2  HGB 13.1 13.0 --  HCT 39.6 39.7 --  MCV 85.2 85.4 --  PLT 273 257 --   Cardiac Enzymes:  Lab 09/24/11 1409 09/24/11 0640 09/24/11 0031 09/23/11 1909  CKTOTAL 494* 566* 624* --  CKMB 3.2 3.6 4.1* --  CKMBINDEX -- -- -- --  TROPONINI -- <0.30 <0.30 <0.30   D-Dimer:  Lab 09/23/11  1455  DDIMER 0.39   Hemoglobin A1C:  Lab 09/23/11 1809  HGBA1C 5.8*   Fasting Lipid Panel:  Lab 09/23/11 1809  CHOL 206*  HDL 82  LDLCALC 114*  TRIG 50  CHOLHDL 2.5  LDLDIRECT --   Coagulation:  Lab 09/23/11 1809  LABPROT 13.8  INR 1.04   Studies/Results: Dg Chest 2 View  09/23/2011  *RADIOLOGY REPORT*  Clinical Data: Acute onset mid chest pain.  History of hypertension.  CHEST - 2 VIEW  Comparison: None.  Findings: Cardiac silhouette upper normal in size.  Thoracic aorta mildly tortuous.  Hilar and mediastinal contours otherwise unremarkable.  Lungs clear.  Bronchovascular markings normal. Pulmonary vascularity normal.  No pneumothorax.  No pleural effusions.  Visualized bony thorax intact.  IMPRESSION: No acute cardiopulmonary disease.   Original Report Authenticated By: Arnell Sieving, M.D.    Ct Angio Chest Pe W/cm &/or Wo Cm  09/23/2011  *RADIOLOGY REPORT*  Clinical Data: Chest pain radiating to back.  Question pulmonary embolus.  Rule out aortic abnormality.  CT ANGIOGRAPHY CHEST  Technique:  Multidetector CT imaging of the chest using  the standard protocol during bolus administration of intravenous contrast. Multiplanar reconstructed images including MIPs were obtained and reviewed to evaluate the vascular anatomy.  Contrast: OMNIPAQUE IOHEXOL 350 MG/ML SOLN  Comparison: None.  Findings: No filling defects in the pulmonary arteries to suggest pulmonary emboli.  Aorta is normal caliber.  The study was performed with pulmonary embolus bolus timing and the aorta is not opacified with contrast.  Cannot exclude dissection.  Heart is mildly enlarged. No mediastinal, hilar, or axillary adenopathy. Visualized thyroid and chest wall soft tissues unremarkable.  Minimal dependent atelectasis in the left lung.  Right lung is clear.  No effusions.  No acute bony abnormality.  Mild degenerative changes in the thoracic spine.  IMPRESSION: No evidence of pulmonary embolus. Timing of the  study was not performed for adequate opacification and evaluation of the aorta. No aneurysm.  Cannot exclude dissection.  Borderline heart size.   Original Report Authenticated By: Cyndie Chime, M.D.    Medications: I have reviewed the patient's current medications. Scheduled Meds:   . aspirin EC  81 mg Oral Daily  . heparin  5,000 Units Subcutaneous Q8H   Continuous Infusions:  PRN Meds:.acetaminophen, HYDROcodone-acetaminophen, nitroGLYCERIN, ondansetron (ZOFRAN) IV Assessment/Plan: Chest pain - Patient states he was unable to tolerate his entire exercise stress test this AM as his BP rose too high. He denies any CP during the testing. Although patient had recurrence of chest pain yesterday evening (resolved by NTG), he has had none this morning. ECG continues to reveal pattern consistent with right-axis deviation and right ventricular strain, however he does not have any clinically correlating features to suggest an underlying etiology of these findings at this time (CXR neg for PTX, PNA; CTA neg for PE). - confer with cardiology regarding stress test results and need for further work-up if inconclusive 2/2 to intolerance  HTN - BP only mildly elevated this AM compared to yesterday, possibly improved secondary to resolution of chest pain. - continue to monitor - defer anti-HTN decisions until stress test results are obtained  Dispo - patient doing well clinically, and ready for discharge if stress test is negative (assuming results were conclusive).   LOS: 2 days   Johnnie Goynes R 09/25/2011, 10:05 AM

## 2011-09-25 NOTE — Progress Notes (Signed)
Patient called and c/o C.P. 5/10 on pain scale. "Feels like stabbing pain in my chest and pressure in my back." Pt. Stated. Skin warm and dry, resp. Even and unlabor.bp 168/110 p. 76 one ntg sl given at 20:15 20:20 still 5/10 bp 148/110 2nd. ntg at 20:20  20:22 pain free bp 144/110.

## 2011-09-25 NOTE — Progress Notes (Signed)
Internal Medicine Attending  Date: 09/25/2011  Patient name: ARSHAN JABS Medical record number: [id number] Date of birth: 1960-08-24 Age: 51 y.o. Gender: male  I saw and evaluated the patient. I reviewed the resident's note by Dr. Lavena Bullion and I agree with the resident's findings and plans as documented in his progress note.  Mr. Mader was unable to complete his exercise ECG secondary to hypertension per his report.  No chest pain during or immediately after the test.  Final read pending.  Since he did not make target HR it is unlikely to be a definitive test unless it was positive.  Will make NPO after midnight and try to get a nuclear stress test in the AM.

## 2011-09-26 ENCOUNTER — Encounter (HOSPITAL_COMMUNITY): Payer: Self-pay | Admitting: Cardiology

## 2011-09-26 DIAGNOSIS — Z8249 Family history of ischemic heart disease and other diseases of the circulatory system: Secondary | ICD-10-CM

## 2011-09-26 DIAGNOSIS — I209 Angina pectoris, unspecified: Secondary | ICD-10-CM

## 2011-09-26 MED ORDER — AMLODIPINE BESYLATE 5 MG PO TABS
5.0000 mg | ORAL_TABLET | Freq: Every day | ORAL | Status: DC
  Administered 2011-09-26 – 2011-09-27 (×2): 5 mg via ORAL
  Filled 2011-09-26 (×2): qty 1

## 2011-09-26 MED ORDER — ISOSORBIDE MONONITRATE ER 30 MG PO TB24
30.0000 mg | ORAL_TABLET | Freq: Every day | ORAL | Status: DC
  Administered 2011-09-26: 30 mg via ORAL
  Filled 2011-09-26 (×2): qty 1

## 2011-09-26 MED ORDER — HYDROCODONE-ACETAMINOPHEN 5-325 MG PO TABS
1.0000 | ORAL_TABLET | ORAL | Status: DC | PRN
  Administered 2011-09-26: 2 via ORAL
  Filled 2011-09-26: qty 2

## 2011-09-26 MED ORDER — OXYCODONE-ACETAMINOPHEN 5-325 MG PO TABS: 2.0000 | ORAL_TABLET | ORAL | Status: DC | PRN

## 2011-09-26 NOTE — Progress Notes (Signed)
Spoke with Dr Collier Bullock re: BP 152/102 manual.  All other VSS, NAD, denies other complaints.  No new orders received at this time.  Will continue to monitor.

## 2011-09-26 NOTE — Progress Notes (Signed)
Reason for Consult Chest pain  Requesting Physician: Teaching service   HPI: This is a 51 y.o. male with a past medical history significant for chest pain. 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 He is retired from [employer] after 66yrs and is followed at the [place] for any medical problems. He has no history of CAD, C/P, DM, or dyslipidemia. He was treated in the past for HTN but says this spring his pressure was low and the Lisinopril he was taking was stopped.Wednesday he was in his usual state of health when he suddenly developed SSCP that radiated to his back, while standing and talking with some friends. He drove himself to the ER and became diaphoretic en route. No radiation to his jaw or arms, no SOB, no nausea. His CKs are elevated but Troponin is negative. CT done was negative for PE or AO dissection. GXT done yesterday but stopped secondary to elevated B/P. He had no C/P with exercise but has had some recurrent pain last evening. He is active and denies any history of exertional C/P or DOE.   PMHx:  Past Medical History  Diagnosis Date  . HTN (hypertension)     b/p meds stopped May 2013 "low B/P"   Past Surgical History  Procedure Date  . Knee surgery 1997    Lt TKR       Shrapnel injury Lt knee and Rt LE while in [employer]   FAMHx: Father has had several heart attacks  SOCHx:  reports that he has never smoked. He does not have any smokeless tobacco history on file. He reports that he uses illicit drugs (Marijuana). He reports that he does not drink alcohol. Separated 4 years, one daughter. Retired early from [employer] after 46yrs.   ALLERGIES: Allergies  Allergen Reactions  . Amoxicillin   . Biaxin (Clarithromycin)   . Eggs Or Egg-Derived Products   . Other     Flu vaccine  . Penicillins    ROS: Pertinent items are noted in HPI. Recent fall, Lt knee injected and NSAIDs added Monday prior to admission  HOME MEDICATIONS: Prescriptions prior to admission    Medication Sig Dispense Refill  . naproxen (NAPROSYN) 500 MG tablet Take 500 mg by mouth 2 (two) times daily with a meal.      . traMADol (ULTRAM) 50 MG tablet Take 50 mg by mouth every 6 (six) hours as needed. For pain       HOSPITAL MEDICATIONS: I have reviewed the patient's current medications.  VITALS: Blood pressure 144/100, pulse 66, temperature 97.7 F (36.5 C), temperature source Oral, resp. rate 18, height 6\' 1"  (1.854 m), weight 91.627 kg (202 lb), SpO2 100.00%.  PHYSICAL EXAM: General appearance: alert, cooperative and no distress Neck: no carotid bruit and no JVD Lungs: clear to auscultation bilaterally Heart: regular rate and rhythm Abdomen: soft, non-tender; bowel sounds normal; no masses,  no organomegaly Extremities: extremities normal, atraumatic, no cyanosis or edema and scar Rt LE and Lt knee. No edema Pulses: 2+ and symmetric Skin: Skin color, texture, turgor normal. No rashes or lesions Neurologic: Grossly normal  LABS: Results for orders placed during the hospital encounter of 09/23/11 (from the past 48 hour(s))  CK TOTAL AND CKMB     Status: Abnormal   Collection Time   09/24/11  2:09 PM      Component Value Range Comment   Total CK 494 (*) 7 - 232 U/L    CK, MB 3.2  0.3 -  4.0 ng/mL    Relative Index 0.6  0.0 - 2.5    EKG- NSR without acute changes  IMAGING: CT chest negative for PE, AO dissection CXR NAD Venous dopplers LE negative for DVT  IMPRESSION: Principal Problem:  *Chest pain - at rest Active Problems:  Hypertension  Family history of coronary artery disease  RECOMMENDATION: MD to see, further recommendations to follow.  Time Spent Directly with Patient:   40 minutes  KILROY,LUKE K 09/26/2011, 11:59 AM   ATTENDING ATTESTATION:  I have seen and examined the patient along with Corine Shelter, PA.  I have reviewed the chart, notes and new data.  I agree with Luke's findings, examination & recommendations as noted above.  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 Consent and did occur at rest was none understood when he suddenly developed a substernal pressure and actually did radiate to his back as well -- this occurred when he was talking to friends. He walked to the secretary's office to let not he may be going to the hospital and therefore he change his mind.  Pain then recurred and he decided to go to the hospital, in route he noticed feeling sweaty and clammy and the symptoms continued until he sees nitroglycerin emergency room. Initial evaluation showed negative PE protocol CT for PE or aortic dissection. He had treadmill stress test done yesterday which he did not restart her heart rate 143 beats a minute but he reached in the mid 130s before the process was stopped due to diastolic hypertension with diastolic pressures in the 130s. Unfortunately we do not have this treadmill ECGs and blood pressure recordings available, as we were not the cardiology group providing service.  However during this submaximal effort with significant hypertension (which would mean he has a relatively significant rate pressure product) did not have any chest pain or shortness of breath consistent with his admitting symptom.  Key examination changes: Essentially benign exam.  Key new findings / data: Cardiac markers negative, CT scan negative, submaximal treadmill stress test negative. ECG performed during chest pain but not revealing of any ischemic changes.  Overall his workup to date has been essentially normal. Wall not reaching target heart rate does make a treadmill stress test less diagnostic. For the sake of trying to  diagnose fixed coronary artery disease enough to cause resting chest pain, this does provide relatively convincing day that in the absence of any ischemic ECG changes or chest pain, his resting chest pain is unlikely to be from a fixed coronary lesion. By deductive reasoning, resting chest pain (that do indeed sound anginal in quality) alleviated with nitroglycerin yet not induced with a relatively significant rate pressure product during treadmill exercise test -- would not be due to a fixed coronary artery lesion, but would be due to coronary vaso-spasm.  PLAN:  I agree with proceeding with a more definitive evaluation for fixed CAD with a Myoview.  We discussed other options including cardiac catheterization; however, with concern for coronary spasm I am less inclined to cardiac catheterization as catheter-induced spasm can lead to high-risk of sustained spasm leading to MI or VT.  By definition, a LexiScan Myoview would not demonstrate coronary spasm as the  adenosine analog would certainly reduce spasm.  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  Recommendation me to treat with long-acting nitrates -- start with Imdur 30 mg.  For his hypertension, my recommendation would be to use amlodipine as opposed HCTZ as a treatment of coronary spasm.  We will try to arrange stress test for tomorrow (or Monday if Sunday slot is not available).   Marykay Lex, M.D., M.S. [facility] [address] [place], [place]  [address]  [phone]  09/26/2011 12:34 PM

## 2011-09-26 NOTE — Progress Notes (Deleted)
Spoke with Dr Collier Bullock, re: manual BP 152/102.  All other VSS, NAD, denies complaints.  No new orders received.

## 2011-09-26 NOTE — Progress Notes (Signed)
Subjective: Episode of chest pain overnight without significant EKG changes, responsive to NTG SL   Objective: Vital signs in last 24 hours: Filed Vitals:   09/25/11 1936 09/26/11 0418 09/26/11 0512 09/26/11 0823  BP: 155/102 143/102 152/102 144/100  Pulse: 93 73 66   Temp:  97.7 F (36.5 C)    TempSrc:  Oral    Resp: 20 20  18   Height:      Weight:      SpO2: 100% 100%     Weight change:   Intake/Output Summary (Last 24 hours) at 09/26/11 1143 Last data filed at 09/26/11 0800  Gross per 24 hour  Intake      0 ml  Output   1275 ml  Net  -1275 ml   Physical Exam: General: Awake and alert; NAD CV: Regular rate and rhythm; no murmurs, rubs, or gallops Resp: Clear to auscultation bilaterally; no wheezes,  no rales (interval improvement), or rhonchi GI/Abd: Soft, non-tender, non-distended; normoactive bowel sounds Ext: 2+ pulses in all extremities; no edema (interval improvement), clubbing, or cyanosis Skin: Warm, dry, intact   Lab Results: Basic Metabolic Panel:  Lab 09/24/11 1610 09/23/11 1809 09/23/11 1103  NA 139 -- 139  K 3.9 -- 4.0  CL 101 -- 104  CO2 27 -- 23  GLUCOSE 89 -- 108*  BUN 13 -- 11  CREATININE 1.10 1.06 --  CALCIUM 9.2 -- 9.2  MG -- -- --  PHOS -- -- --   Liver Function Tests:  Lab 09/23/11 1103  AST 38*  ALT 45  ALKPHOS 64  BILITOT 0.3  PROT 7.4  ALBUMIN 4.1   CBC:  Lab 09/24/11 0640 09/23/11 1809 09/23/11 1103  WBC 7.2 7.7 --  NEUTROABS -- -- 5.2  HGB 13.1 13.0 --  HCT 39.6 39.7 --  MCV 85.2 85.4 --  PLT 273 257 --   Cardiac Enzymes:  Lab 09/24/11 1409 09/24/11 0640 09/24/11 0031 09/23/11 1909  CKTOTAL 494* 566* 624* --  CKMB 3.2 3.6 4.1* --  CKMBINDEX -- -- -- --  TROPONINI -- <0.30 <0.30 <0.30   D-Dimer:  Lab 09/23/11 1455  DDIMER 0.39   Hemoglobin A1C:  Lab 09/23/11 1809  HGBA1C 5.8*   Fasting Lipid Panel:  Lab 09/23/11 1809  CHOL 206*  HDL 82  LDLCALC 114*  TRIG 50  CHOLHDL 2.5  LDLDIRECT --    Coagulation:  Lab 09/23/11 1809  LABPROT 13.8  INR 1.04   Studies/Results: No results found. Medications: I have reviewed the patient's current medications. Scheduled Meds:    . aspirin EC  81 mg Oral Daily  . heparin  5,000 Units Subcutaneous Q8H  . hydrochlorothiazide  25 mg Oral Daily   Continuous Infusions:  PRN Meds:.acetaminophen, HYDROcodone-acetaminophen, nitroGLYCERIN, ondansetron (ZOFRAN) IV, DISCONTD: HYDROcodone-acetaminophen, DISCONTD: oxyCODONE-acetaminophen Assessment/Plan: HD# 3 for 51 yo admitted with atypical chest pain ruled out for acute MI with EKG and cardiac enzymes, indeterminate exercise stress but continued intermittent chest pain.  Chest pain - Patient states he was unable to tolerate his entire exercise stress test yesterday secondary to elevated bp w/o chest pain. ECG continues to reveal pattern consistent with right-axis deviation and right ventricular strain, however he does not have any clinically correlating features to suggest an underlying etiology of these findings at this time (CXR neg for PTX, PNA; CTA neg for PE). -Cardiology consult today for further recommendations ie LexiScan  HTN - still above goal, 142-171-98-111 pulse 66-96 bpm - continue to monitor - defer anti-HTN  decisions until stress test results are obtained  Dispo - awaiting further recommendations per Cardiology concerning his need for possible further evaluation or discharge home with outpt follow-up.   LOS: 3 days   Lance Mills 09/26/2011, 11:43 AM

## 2011-09-27 ENCOUNTER — Inpatient Hospital Stay (HOSPITAL_COMMUNITY): Payer: Non-veteran care

## 2011-09-27 LAB — CK TOTAL AND CKMB (NOT AT ARMC)
CK, MB: 2.6 ng/mL (ref 0.3–4.0)
Total CK: 235 U/L — ABNORMAL HIGH (ref 7–232)
Total CK: 260 U/L — ABNORMAL HIGH (ref 7–232)

## 2011-09-27 LAB — TROPONIN I: Troponin I: <0.3 ng/mL (ref <0.30)

## 2011-09-27 LAB — TSH: TSH: 0.576 u[IU]/mL (ref 0.350–4.500)

## 2011-09-27 MED ORDER — NITROGLYCERIN 0.4 MG SL SUBL: 0.4000 mg | SUBLINGUAL_TABLET | SUBLINGUAL | Status: DC | PRN

## 2011-09-27 MED ORDER — TECHNETIUM TC 99M SESTAMIBI GENERIC - CARDIOLITE
30.0000 | Freq: Once | INTRAVENOUS | Status: AC | PRN
  Administered 2011-09-27: 30 via INTRAVENOUS

## 2011-09-27 MED ORDER — TECHNETIUM TC 99M SESTAMIBI GENERIC - CARDIOLITE
10.0000 | Freq: Once | INTRAVENOUS | Status: AC | PRN
  Administered 2011-09-27: 10 via INTRAVENOUS

## 2011-09-27 MED ORDER — DILTIAZEM HCL ER COATED BEADS 180 MG PO CP24: 180.0000 mg | ORAL_CAPSULE | Freq: Every day | ORAL | Status: DC

## 2011-09-27 MED ORDER — REGADENOSON 0.4 MG/5ML IV SOLN
0.4000 mg | Freq: Once | INTRAVENOUS | Status: AC
  Administered 2011-09-27: 0.4 mg via INTRAVENOUS

## 2011-09-27 MED ORDER — ASPIRIN 81 MG PO TBEC: 81.0000 mg | DELAYED_RELEASE_TABLET | Freq: Every day | ORAL | Status: AC

## 2011-09-27 NOTE — Progress Notes (Signed)
Pt started experiencing right shoulder pain. Pt realized the right shoulder around scapula 2 o'clock to be swollen. Gave pt a massage per request with lotion. Administered 2 Vicodin for headache. Pt requested to go for a walk which he went around the unit. Tolerated fine. Paged Dr. Virgina Organ who ordered some labs and was on the way to evaluate the pt. When he returned to his room and rested for approximately 10-15 min, he started vomiting. Zofran 4mg  was administered and MD updated. Pt also c/o of chills. Vitals signs taken also EKG. BP slightly elevated. See Doc flowsheets. Right shoulder marked for monitoring to note increase in size. The swelling seem tender on palpation. Labs negative except for CK total=232. MD notified. Will continue to monitor per MD.

## 2011-09-27 NOTE — Progress Notes (Signed)
Subjective:  C/O nausea and dizziness, no chest pain. He also developed pain Rt shoulder last night.  Objective:  Vital Signs in the last 24 hours: Temp:  [97.3 F (36.3 C)-98.6 F (37 C)] 98.6 F (37 C) (09/15 0635) Pulse Rate:  [78-106] 88  (09/15 0635) Resp:  [18] 18  (09/15 0635) BP: (104-144)/(70-100) 104/70 mmHg (09/15 0635) SpO2:  [99 %-100 %] 99 % (09/15 0635)  Intake/Output from previous day:  Intake/Output Summary (Last 24 hours) at 09/27/11 0819 Last data filed at 09/26/11 1400  Gross per 24 hour  Intake      0 ml  Output    400 ml  Net   -400 ml    Physical Exam: General appearance: alert, cooperative and no distress Lungs: clear to auscultation bilaterally Heart: regular rate and rhythm Abd: soft, NT, ND, NABS Ext: no c/c/e MSK - no point tenderness on chest.   Rate: 100  Rhythm: normal sinus rhythm and sinus tachycardia  Lab Results:  Basename 09/27/11 0144  TROPONINI <0.30   Imaging: Imaging results have been reviewed  Cardiac Studies:  Assessment/Plan:   Principal Problem:  *Chest pain, elevated CK, negative Troponin Active Problems:  Hypertension  Family history of coronary artery disease  Plan- Myoview today. Sinus tachycardia at rest ? etiol, CTA negative for PE, check TSH.  Corine Shelter PA-C 09/27/2011, 8:19 AM  I've seen and evaluated the patient along with Mr. Diona Fanti. Agree with his findings, examined recommendations.  Mr. Dowty he has not had any further chest pain.  He has had lower blood pressures and is his norm. This would likely explain his dizziness and nausea.  I will D/C HCTZ in light of the addition of amlodipine which is a more potent antihypertensive agent.  Low-dose Imdur at night and amlodipine the morning.  Will await the results as Myoview stress test for further recommendations.  Marykay Lex, M.D., M.S. [facility] [address] [place], [place]   [address]  [phone] Pager # [phone]  09/27/2011 8:57 AM

## 2011-09-27 NOTE — Progress Notes (Signed)
Discharged to home with family office visits in place teaching done  

## 2011-09-27 NOTE — Progress Notes (Addendum)
Pt told me he received Imdur then got a severe headache and was given 2 Percocet  which led to nausea and vomiting. Will stop Imdur. Consider Verapamil or Diltiazem with sinus tachycardia.   Corine Shelter PA-C 09/27/2011 10:16 AM  I have reviewed his Myoview ST -- no evidence of ischemia as expected.  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   As he is hypertensive & has tachycardia -- I agree with changing CCB to Diltiazem. Can d/c Imdur since he seems not to tolerate it.  Would Rx PRN SL NTG.    Would start Diltiazem ~ 180mg  daily in place of Amlodipine.  Will also recommend PRN SL NTG.   I will be happy to see him in f/u @ [facility].   Will likely order Renal Dopplers to evaluate HTN.  Marykay Lex, M.D., M.S. [facility] [address] [place], [place]  [address]  [phone] Pager # [phone]  09/27/2011 11:39 AM

## 2011-09-27 NOTE — Plan of Care (Signed)
Problem: Phase III Progression Outcomes Goal: Hemodynamically stable Outcome: Progressing Pt's vital signs stable except for pain. Please review previous report by same author under notes. Goal: No anginal pain Outcome: Completed/Met Date Met:  09/27/11 Denies chest pain. Comfortably sleeping. Goal: Tolerating diet Outcome: Not Progressing Pt vomited after administering 2 vicodin. States appetite pretty good. NPO for procedure in the am.

## 2011-09-27 NOTE — Discharge Summary (Signed)
Patient Name:  Lance Mills MRN: [id number]  PCP: Provider Default, MD DOB:  January 01, 1961       Date of Admission:  09/23/2011  Date of Discharge:  09/27/2011      Attending Physician: Dr. Rocco Serene, MD         DISCHARGE DIAGNOSES: Chest pain HTN   DISPOSITION AND FOLLOW-UP: Lance Mills is to follow-up with the listed providers as detailed below, at which time, the following should be addressed:   1. F/u responsiveness of chest pain to initiation of diltiazem and PRN nitroglycerin. 2. Labs / imaging needed: N/A 3. Pending labs/ test needing follow-up: N/A  Follow-up Information    Follow up with Dr. Drue Second. (Please call to make an appointment to follow-up with Dr. Drue Second, your primary care provider at the [facility], within the next 2 weeks.)       Follow up with HARDING,DAVID W, MD. (If you are not contacted with an appointment time within the following week, please contact the number provided to schedule a follow-up appointment with Dr. Herbie Baltimore as soon as possible.)    Contact information:   [facility] [address] [place] [address] [phone]         Discharge Orders    Future Orders Please Complete By Expires   Diet - low sodium heart healthy      Increase activity slowly          DISCHARGE MEDICATIONS:   Medication List     As of 09/27/2011  3:24 PM    TAKE these medications         aspirin 81 MG EC tablet   Take 1 tablet (81 mg total) by mouth daily.      diltiazem 180 MG 24 hr capsule   Commonly known as: CARDIZEM CD   Take 1 capsule (180 mg total) by mouth daily.      naproxen 500 MG tablet   Commonly known as: NAPROSYN   Take 500 mg by mouth 2 (two) times daily with a meal.      nitroGLYCERIN 0.4 MG SL tablet   Commonly known as: NITROSTAT   Place 1 tablet (0.4 mg total) under the tongue every 5 (five) minutes x 3 doses as needed for chest pain.      traMADol 50 MG tablet   Commonly known as:  ULTRAM   Take 50 mg by mouth every 6 (six) hours as needed. For pain         CONSULTS:  Dr. Herbie Baltimore -- Cardiology   PROCEDURES PERFORMED:  Dg Chest 2 View  09/23/2011  *RADIOLOGY REPORT*  Clinical Data: Acute onset mid chest pain.  History of hypertension.  CHEST - 2 VIEW  Comparison: None.  Findings: Cardiac silhouette upper normal in size.  Thoracic aorta mildly tortuous.  Hilar and mediastinal contours otherwise unremarkable.  Lungs clear.  Bronchovascular markings normal. Pulmonary vascularity normal.  No pneumothorax.  No pleural effusions.  Visualized bony thorax intact.  IMPRESSION: No acute cardiopulmonary disease.   Original Report Authenticated By: Arnell Sieving, M.D.    Ct Angio Chest Pe W/cm &/or Wo Cm  09/23/2011  *RADIOLOGY REPORT*  Clinical Data: Chest pain radiating to back.  Question pulmonary embolus.  Rule out aortic abnormality.  CT ANGIOGRAPHY CHEST  Technique:  Multidetector CT imaging of the chest using the standard protocol during bolus administration of intravenous contrast. Multiplanar reconstructed images including MIPs were obtained and reviewed to  evaluate the vascular anatomy.  Contrast: OMNIPAQUE IOHEXOL 350 MG/ML SOLN  Comparison: None.  Findings: No filling defects in the pulmonary arteries to suggest pulmonary emboli.  Aorta is normal caliber.  The study was performed with pulmonary embolus bolus timing and the aorta is not opacified with contrast.  Cannot exclude dissection.  Heart is mildly enlarged. No mediastinal, hilar, or axillary adenopathy. Visualized thyroid and chest wall soft tissues unremarkable.  Minimal dependent atelectasis in the left lung.  Right lung is clear.  No effusions.  No acute bony abnormality.  Mild degenerative changes in the thoracic spine.  IMPRESSION: No evidence of pulmonary embolus. Timing of the study was not performed for adequate opacification and evaluation of the aorta. No aneurysm.  Cannot exclude dissection.   Borderline heart size.   Original Report Authenticated By: Cyndie Chime, M.D.    Nm Myocar Multi W/spect W/wall Motion / Ef  09/27/2011  *RADIOLOGY REPORT*  Clinical Data:  51 year old with history of hypertension and family history of coronary artery disease, presenting with chest pain.  MYOCARDIAL IMAGING WITH SPECT (REST AND PHARMACOLOGIC-STRESS) GATED LEFT VENTRICULAR WALL MOTION STUDY LEFT VENTRICULAR EJECTION FRACTION  Technique:  Standard myocardial SPECT imaging was performed after resting intravenous injection of 11 mCi Tc-10m tetrofosmin. Subsequently, intravenous infusion of regadenoson was performed under the supervision of the Cardiology staff.  At peak effect of the drug, 03/03 mCi Tc-43m tetrofosmin was injected intravenously and standard myocardial SPECT  imaging was performed.  Quantitative gated imaging was also performed to evaluate left ventricular wall motion, and estimate left ventricular ejection fraction.  Comparison:  No prior nuclear myocardial perfusion imaging.  CTA chest 09/23/2011.  Findings: Immediate post-regadenoson images demonstrate no focal myocardial perfusion abnormality.  Initial resting images with similar findings.  No evidence of reversibility to suggest ischemia.  Findings confirmed by the computer generated polar map.  Gated images demonstrate satisfactory thickening throughout the left ventricular myocardium with normal wall motion throughout.  Estimated Q G S ejection fraction measured 56%, with an end- diastolic volume of 71 ml and an end-systolic volume of 31 ml.  IMPRESSION:  1.  No evidence of myocardial ischemia or infarction.  Normal myocardial perfusion imaging. 2.  Normal left ventricular wall motion. 3.  Estimated Q G S ejection fraction 56%.   Original Report Authenticated By: Arnell Sieving, M.D.        ADMISSION DATA: H&P: Patient is a 51 y.o. male with a PMHx of HTN, who presents to [facility] for evaluation of chest pain. The pain began at 930AM on  the day of admission. It began suddenly as patient was standing talking with friends. Pain was located in the center of the chest, was stabbing in quality, with radiation of pressure-like pain to the back. Patient states the pain had been constant since that time and did not resolve until he received NTG in the ED, which he states temporarily resolved the pain, but after which it returned. He subsequently received morphine in the ED, which he states resolved his chest pain. Patient states he felt the pain was worse when his heart beats. He denies any other alleviating or exacerbating factors. No positional change in pain. States that his pain was accompanied by diaphoresis, but denies any SOB. Denies palpitations or leg swelling. Denies ever having chest pain in the past prior to this event. He denies heartburn or history of GERD.  Physical Exam: General:  Vital signs reviewed and noted. Well-developed, well-nourished, in no acute distress;  alert, appropriate and cooperative throughout examination.   Head:  Normocephalic, atraumatic.   Eyes:  PERRL, EOMI, No signs of anemia or jaundice.   Nose:  Mucous membranes moist, not inflammed, nonerythematous.   Throat:  Oropharynx nonerythematous, no exudate appreciated.    Neck:  No deformities, masses, or tenderness noted.   Lungs:   Normal respiratory effort. Bibasilar rales.   Heart:  RRR. S1 and S2 normal without gallop, murmur, or rubs.   Abdomen:   BS normoactive. Soft, Nondistended, non-tender.   Extremities:  Trace pretibial edema.   Neurologic:  A&O X3, CN II - XII are grossly intact. Motor strength is 5/5 in the all 4 extremities, Sensations intact to light touch, Cerebellar signs negative.   Skin:  No visible rashes, scars.    Labs: Basic Metabolic Panel:   Basename  09/23/11 1103   NA  139   K  4.0   CL  104   CO2  23   GLUCOSE  108*   BUN  11   CREATININE  0.97   CALCIUM  9.2   MG  --   PHOS  --    Liver Function Tests:    Basename  09/23/11 1103   AST  38*   ALT  45   ALKPHOS  64   BILITOT  0.3   PROT  7.4   ALBUMIN  4.1    CBC:   Basename  09/23/11 1103   WBC  7.2   NEUTROABS  5.2   HGB  13.1   HCT  39.1   MCV  84.6   PLT  280    Cardiac Enzymes:   Basename  09/23/11 1318  09/23/11 1103   CKTOTAL  --  --   CKMB  --  --   CKMBINDEX  --  --   TROPONINI  <0.30  <0.30    D-Dimer:   Basename  09/23/11 1455   DDIMER  0.39     HOSPITAL COURSE: Chest pain - patient presented with complaints of sharp, sternal chest pain radiating to his back. ECG revealed an S1Q3T3 pattern, which was concerning for pulmonary embolism in the context of his chest pain. CTA chest did not reveal PE, and was also negative for thoracic aortic aneurysm or aortic dissection (although dissection not completely excluded due to PE-protocol contrast timing). As patient's CP was responsive to NTG, concern was present for angina (after ACS was ruled out by neg troponin x 3 with no focal ST changes on ECG). Patient underwent exercise stress test but was unable to finish the exam due to prohibitive hypertension (>240/120). Cardiology was consulted and patient subsequently underwent myoview stress testing which did not reveal any areas of ischemia or infarction, and demonstrated normal left ventricular wall motion. The patient was deemed to most likely have variant angina (vasospasm-induced), and was started on diltiazem 180mg /day and sublingual NTG (PRN) to treat this issue (was unable to tolerate imdur without HA/nausea).  HTN -  Elevated BPs during hospital course. Patient was previously on lisinopril, but informed admitting team that this was discontinued by his PCP due to occasional hypotension. At discharge, he was started on diltiazem for his variant angina, which will also address his hypertension.  Dispo - patient will follow-up with Dr. Herbie Baltimore (Cardiology) to assess the efficacy of his daily diltiazem regimen in controlling  his symptoms of angina associated with coronary artery vasospasm. Patient will also follow-up with his PCP, Dr. Drue Second, at the [facility], for his hypertension  and to assess response to diltiazem monotherapy.  DISCHARGE DATA: Vital Signs: BP 142/93  Pulse 96  Temp 98.5 F (36.9 C) (Oral)  Resp 16  Ht 6\' 1"  (1.854 m)  Wt 202 lb (91.627 kg)  BMI 26.65 kg/m2  SpO2 97%  Labs: Results for orders placed during the hospital encounter of 09/23/11 (from the past 24 hour(s))  TROPONIN I     Status: Normal   Collection Time   09/27/11  1:44 AM      Component Value Range   Troponin I <0.30  <0.30 ng/mL  CK TOTAL AND CKMB     Status: Abnormal   Collection Time   09/27/11  1:44 AM      Component Value Range   Total CK 235 (*) 7 - 232 U/L   CK, MB 2.6  0.3 - 4.0 ng/mL   Relative Index 1.1  0.0 - 2.5  TROPONIN I     Status: Normal   Collection Time   09/27/11 11:57 AM      Component Value Range   Troponin I <0.30  <0.30 ng/mL  CK TOTAL AND CKMB     Status: Abnormal   Collection Time   09/27/11 11:57 AM      Component Value Range   Total CK 260 (*) 7 - 232 U/L   CK, MB 2.3  0.3 - 4.0 ng/mL   Relative Index 0.9  0.0 - 2.5    Signed: Elfredia Nevins, MD   PGY I, Internal Medicine Resident 09/27/2011, 3:24 PM

## 2011-09-27 NOTE — Progress Notes (Signed)
Subjective: Patient with no complaints at time of exam. Denies CP, SOB. States he had 2/10 CP during lexiscan this AM, which resolved without intervention.   Overnight patient had right shoulder pain. Given recurrent CP episodes during his hospital course, an ECG was obtained, which was not revealing of any new ST elevation, and was consistent with previous ECG findings.  Objective: Vital signs in last 24 hours: Filed Vitals:   09/27/11 0902 09/27/11 0917 09/27/11 0919 09/27/11 0921  BP: 147/98 166/87 142/89 143/101  Pulse: 86 115 118 110  Temp:      TempSrc:      Resp:      Height:      Weight:      SpO2:       Weight change:   Intake/Output Summary (Last 24 hours) at 09/27/11 1037 Last data filed at 09/26/11 1400  Gross per 24 hour  Intake      0 ml  Output    400 ml  Net   -400 ml   Physical Exam: General: Awake and alert; NAD CV: Regular rate and rhythm; no murmurs, rubs, or gallops Resp: Mild basilar rales on L. Otherwise CTA. GI/Abd: Soft, non-tender, non-distended; normoactive bowel sounds Ext: Trace pitting edema bilaterally.  Skin: Warm, dry, intact MSK: increased tone and muscular prominence in area of right trapezius with TTP of area. No erythema or increased warmth.  Lab Results: Basic Metabolic Panel:  Lab 09/24/11 7829 09/23/11 1809 09/23/11 1103  NA 139 -- 139  K 3.9 -- 4.0  CL 101 -- 104  CO2 27 -- 23  GLUCOSE 89 -- 108*  BUN 13 -- 11  CREATININE 1.10 1.06 --  CALCIUM 9.2 -- 9.2  MG -- -- --  PHOS -- -- --   Liver Function Tests:  Lab 09/23/11 1103  AST 38*  ALT 45  ALKPHOS 64  BILITOT 0.3  PROT 7.4  ALBUMIN 4.1   CBC:  Lab 09/24/11 0640 09/23/11 1809 09/23/11 1103  WBC 7.2 7.7 --  NEUTROABS -- -- 5.2  HGB 13.1 13.0 --  HCT 39.6 39.7 --  MCV 85.2 85.4 --  PLT 273 257 --   Cardiac Enzymes:  Lab 09/27/11 0144 09/24/11 1409 09/24/11 0640 09/24/11 0031  CKTOTAL 235* 494* 566* --  CKMB 2.6 3.2 3.6 --  CKMBINDEX -- -- -- --    TROPONINI <0.30 -- <0.30 <0.30   D-Dimer:  Lab 09/23/11 1455  DDIMER 0.39   Hemoglobin A1C:  Lab 09/23/11 1809  HGBA1C 5.8*   Fasting Lipid Panel:  Lab 09/23/11 1809  CHOL 206*  HDL 82  LDLCALC 114*  TRIG 50  CHOLHDL 2.5  LDLDIRECT --   Coagulation:  Lab 09/23/11 1809  LABPROT 13.8  INR 1.04   Medications: I have reviewed the patient's current medications. Scheduled Meds:   . amLODipine  5 mg Oral Daily  . aspirin EC  81 mg Oral Daily  . heparin  5,000 Units Subcutaneous Q8H  . hydrochlorothiazide  25 mg Oral Daily  . regadenoson  0.4 mg Intravenous Once  . DISCONTD: isosorbide mononitrate  30 mg Oral Daily   Continuous Infusions:  PRN Meds:.acetaminophen, nitroGLYCERIN, ondansetron (ZOFRAN) IV, technetium sestamibi generic, technetium sestamibi generic, DISCONTD: HYDROcodone-acetaminophen Assessment/Plan: HD# 4 for 51 yo admitted with atypical chest pain ruled out for acute MI with EKG and cardiac enzymes, indeterminate exercise stress but continued intermittent chest pain. Underwent lexiscan this AM with results pending.  Chest pain - No episodes of significant  chest pain after patient was started on amlodipine and imdur yesterday afternoon. Cardiology is following and suspecting coronary artery vasospasm as the underlying etiology of the patient's chest pain. Patient developed HA after starting imdur, thus cardiology recommending low-dose imdur and only qhs. Patient underwent lexiscan this AM due to his intolerance of exercise stress testing on 9/13. - await lexiscan results  HTN - Interval improvement of BP with initiation of amlodipine and imdur. Cardiology recommending d/c HCTZ as patient is more in need of CCB due to concern for coronary artery vasospasm. - continue to monitor  - d/c HCTZ - cont amlodipine - low-dose imdur qhs  R shoulder pain - likely increased muscle tone secondary to stress. Patient noticed the pain yesterday evening, with associated  "muscle knot" in the corresponding area. Patient noted to have significant anxiety regarding his health during his hospital course. On exam, patient was TTP and there was increased muscular prominence and tension of the R trapezius. If patient develops worsened swelling, further evaluation may be necessary to rule out cellulitis, but no concerning features at present. If this shoulder pain is indeed MSK in origin, it is possible that the patient's CP is also of the same nature if his lexiscan results are negative. - continue to monitor - tylenol PRN  Dispo - awaiting results from lexiscan. Will confer with cardiology regarding results. If negative, patient may be appropriate for discharge today, with appropriate follow-up with PCP ([facility]).   LOS: 4 days   Lance Mills R 09/27/2011, 10:37 AM

## 2011-09-29 NOTE — Care Management Note (Signed)
    Page 1 of 1   09/29/2011     2:55:38 PM   CARE MANAGEMENT NOTE 09/29/2011  Patient:  Lance Mills, Lance Mills   Account Number:  [id number]  Date Initiated:  09/29/2011  Documentation initiated by:  Hadriel Northup  Subjective/Objective Assessment:   PT ADM ON 09/23/11 WITH CHEST PAIN.  PTA, PT INDEPENDENT, LIVES WITH WIFE.     Action/Plan:   WILL FOLLOW PT FOR DC NEEDS AS HE PROGRESSES.   Anticipated DC Date:  09/27/2011   Anticipated DC Plan:  HOME/SELF CARE      DC Planning Services  CM consult      Choice offered to / List presented to:             Status of service:  Completed, signed off Medicare Important Message given?   (If response is "NO", the following Medicare IM given date fields will be blank) Date Medicare IM given:   Date Additional Medicare IM given:    Discharge Disposition:  HOME/SELF CARE  Per UR Regulation:  Reviewed for med. necessity/level of care/duration of stay  If discussed at Long Length of Stay Meetings, dates discussed:    Comments:

## 2011-09-30 NOTE — ED Provider Notes (Signed)
Medical screening examination/treatment/procedure(s) were conducted as a shared visit with non-physician practitioner(s) and myself.  I personally evaluated the patient during the encounter  Cheri Guppy, MD 09/30/11 [phone]

## 2013-06-28 IMAGING — CR DG CHEST 2V
2 series · 2 of 2 positions shown · non-contrast
Comparison: None.

CLINICAL DATA: Acute onset mid chest pain.  History of
hypertension.

CHEST - 2 VIEW

[w chest pa]
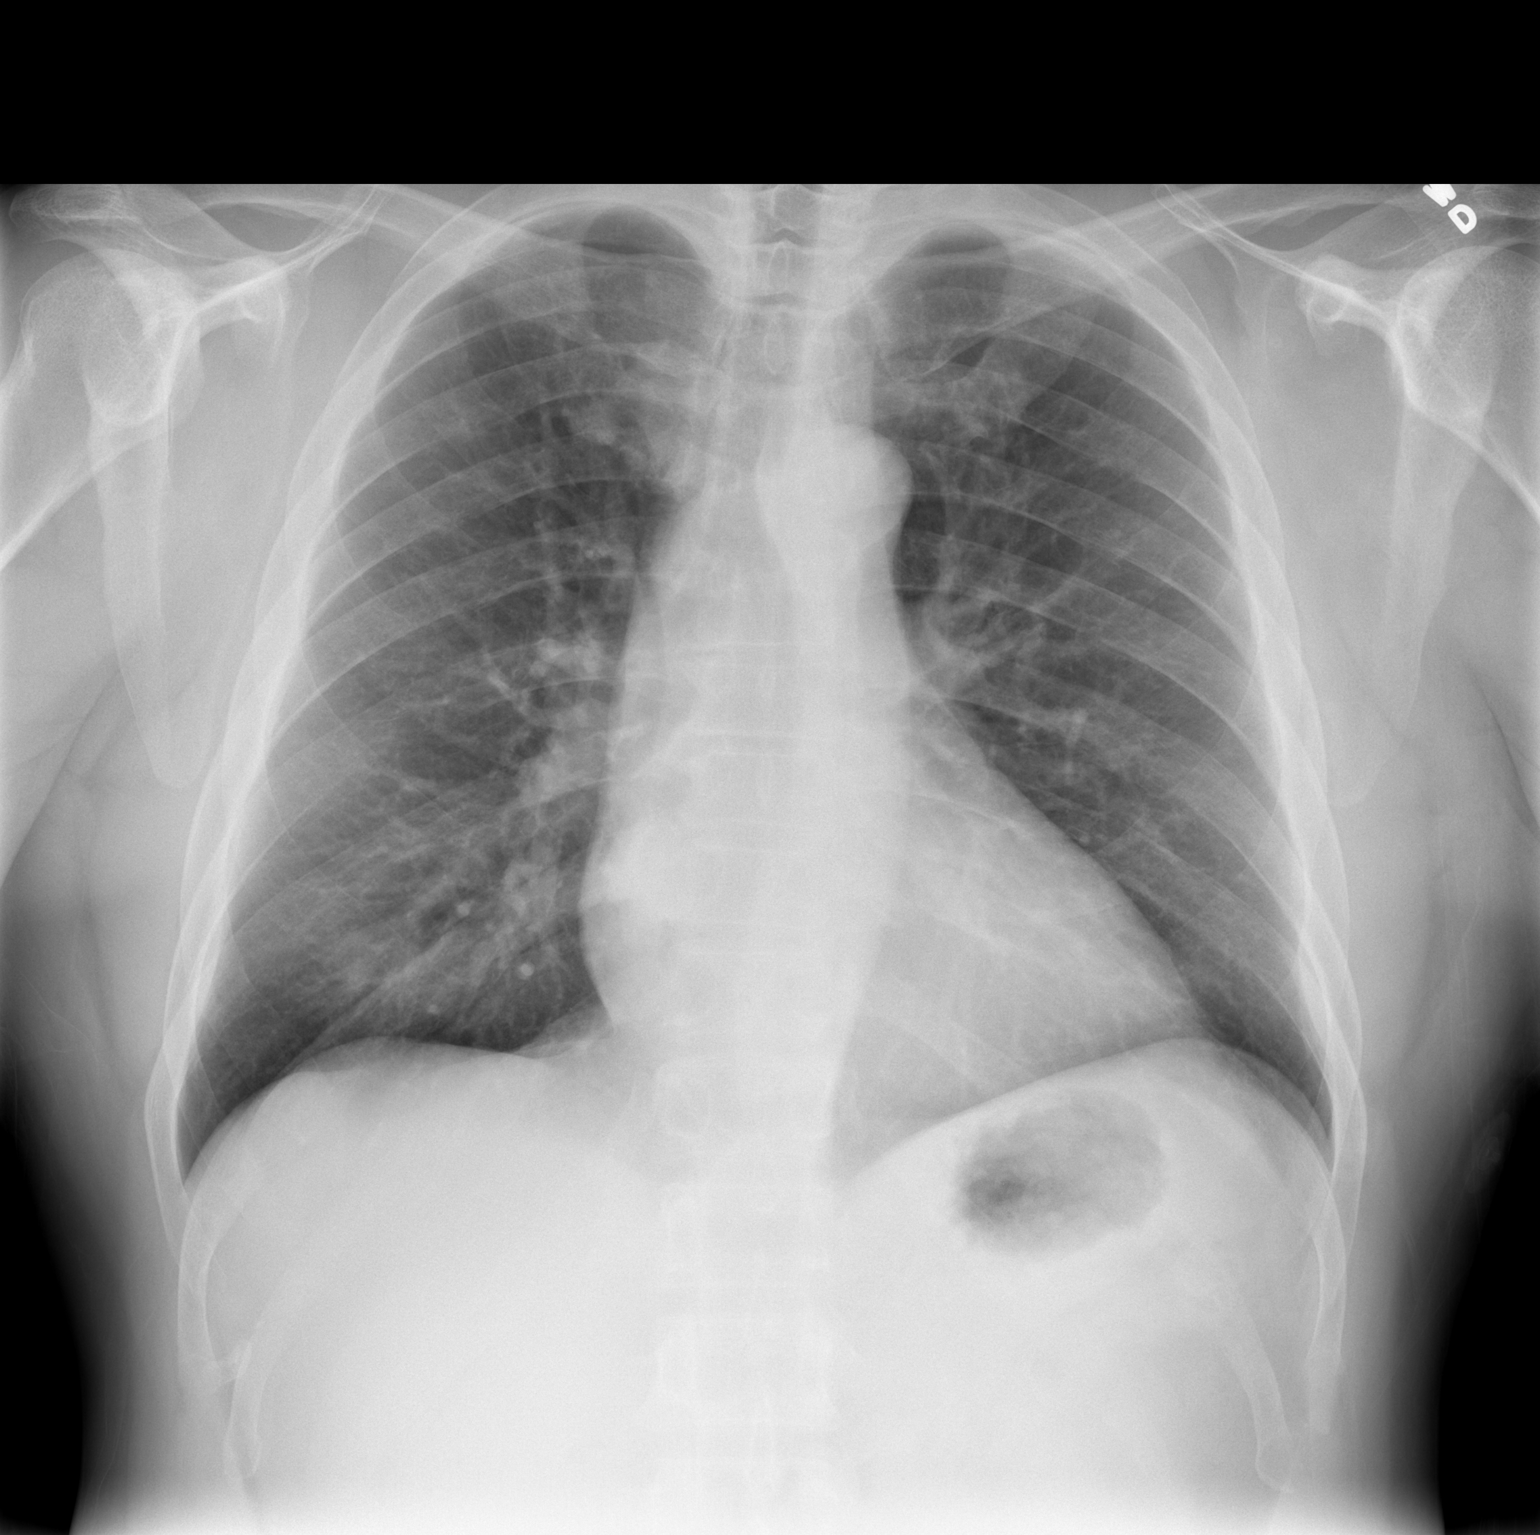

[w chest lat]
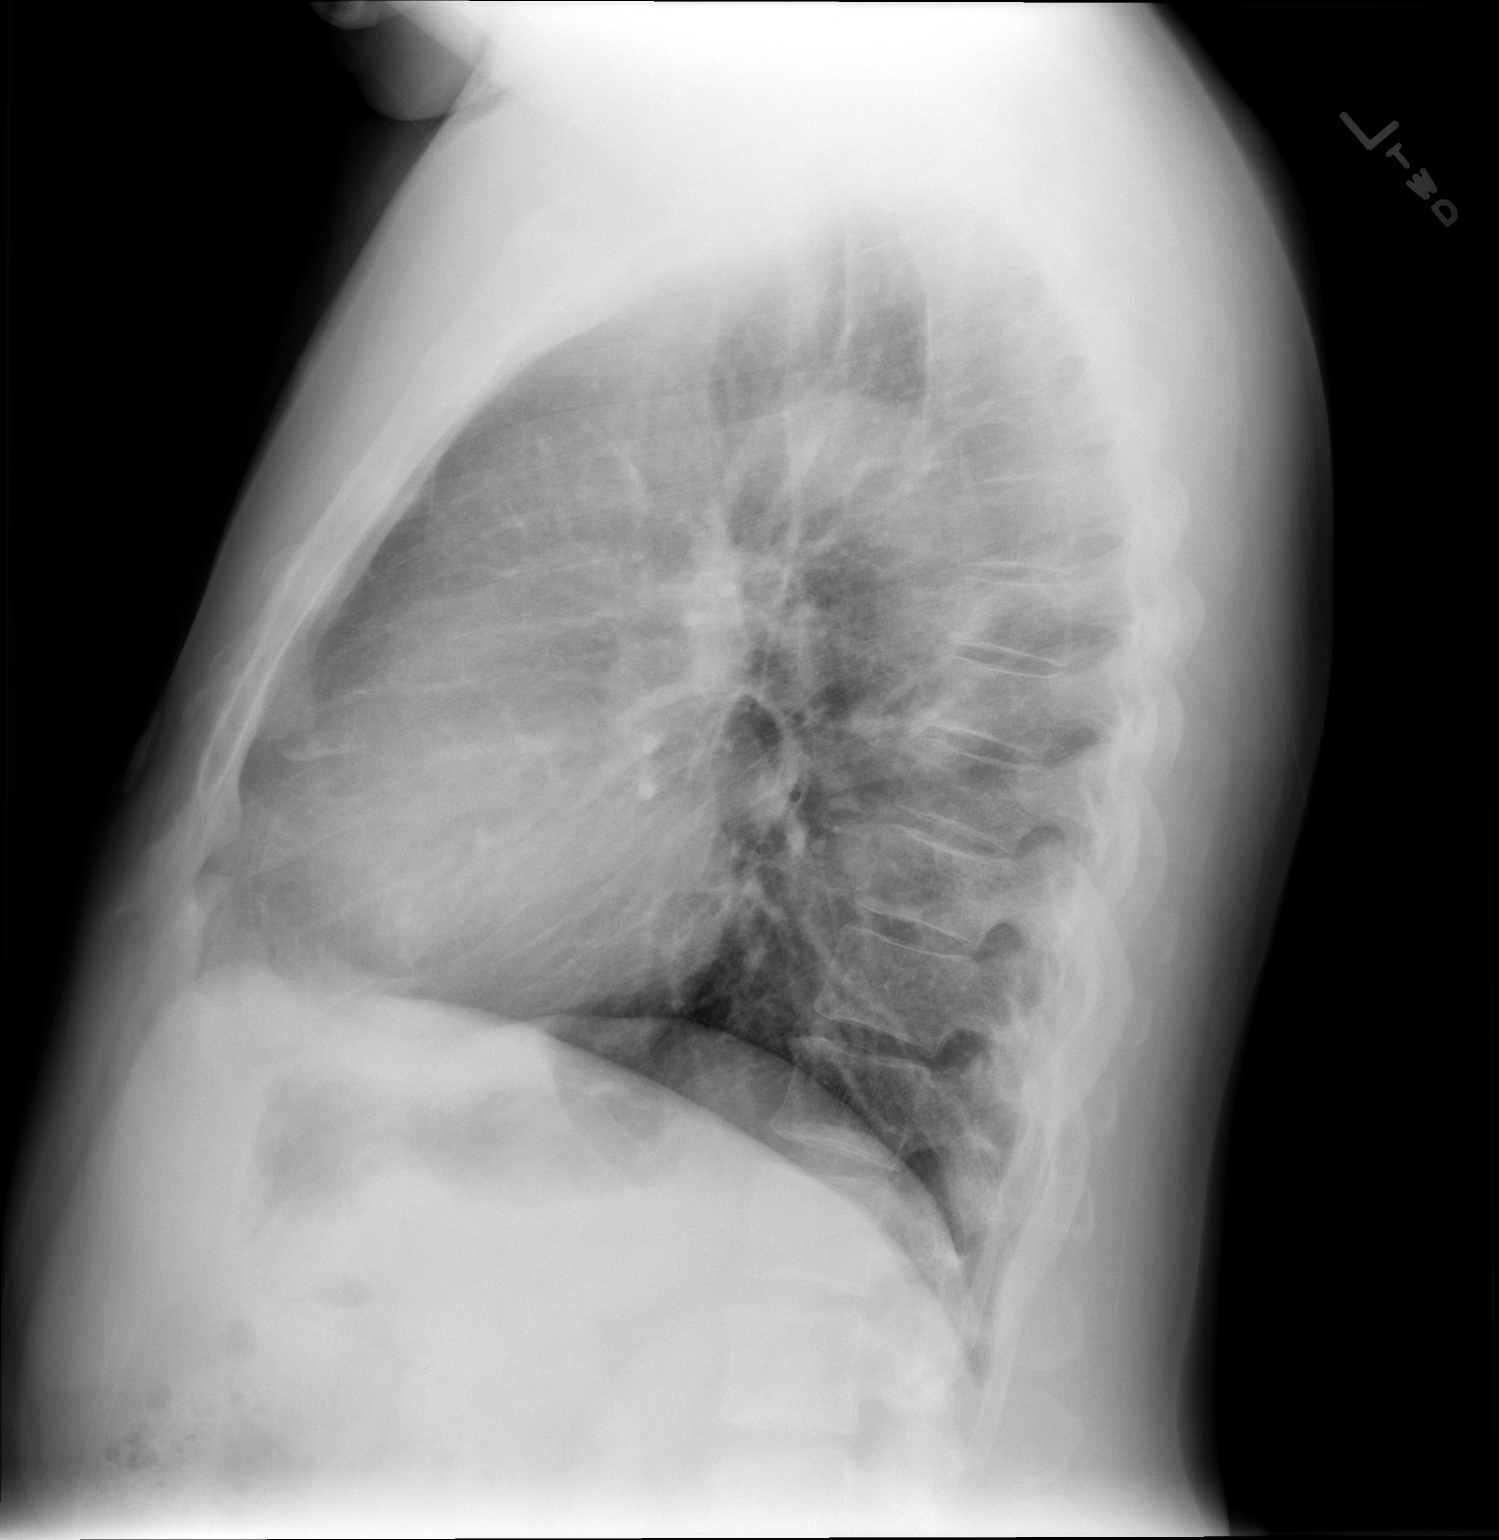

[2 of 2 positions shown; findings below may reference images not displayed]

FINDINGS: Cardiac silhouette upper normal in size.  Thoracic aorta
mildly tortuous.  Hilar and mediastinal contours otherwise
unremarkable.  Lungs clear.  Bronchovascular markings normal.
Pulmonary vascularity normal.  No pneumothorax.  No pleural
effusions.  Visualized bony thorax intact.
IMPRESSION: No acute cardiopulmonary disease.

## 2021-12-07 ENCOUNTER — Emergency Department (HOSPITAL_COMMUNITY): Payer: No Typology Code available for payment source

## 2021-12-07 ENCOUNTER — Encounter (HOSPITAL_COMMUNITY): Payer: Self-pay

## 2021-12-07 ENCOUNTER — Emergency Department (HOSPITAL_COMMUNITY)
Admission: EM | Admit: 2021-12-07 | Discharge: 2021-12-07 | Disposition: A | Discharge status: Home / Self Care | Payer: No Typology Code available for payment source | Attending: Student | Admitting: Student

## 2021-12-07 ENCOUNTER — Other Ambulatory Visit: Payer: Self-pay

## 2021-12-07 DIAGNOSIS — R0602 Shortness of breath: Secondary | ICD-10-CM | POA: Insufficient documentation

## 2021-12-07 DIAGNOSIS — R55 Syncope and collapse: Secondary | ICD-10-CM | POA: Insufficient documentation

## 2021-12-07 DIAGNOSIS — R61 Generalized hyperhidrosis: Secondary | ICD-10-CM | POA: Insufficient documentation

## 2021-12-07 DIAGNOSIS — E119 Type 2 diabetes mellitus without complications: Secondary | ICD-10-CM | POA: Diagnosis not present

## 2021-12-07 DIAGNOSIS — I10 Essential (primary) hypertension: Secondary | ICD-10-CM | POA: Diagnosis not present

## 2021-12-07 LAB — CBC
HCT: 41.9 % (ref 39.0–52.0)
Hemoglobin: 13.3 g/dL (ref 13.0–17.0)
MCH: 28.9 pg (ref 26.0–34.0)
MCHC: 31.7 g/dL (ref 30.0–36.0)
MCV: 90.9 fL (ref 80.0–100.0)
Platelets: 374 10*3/uL (ref 150–400)
RBC: 4.61 MIL/uL (ref 4.22–5.81)
RDW: 13.4 % (ref 11.5–15.5)
WBC: 7 10*3/uL (ref 4.0–10.5)
nRBC: 0 % (ref 0.0–0.2)

## 2021-12-07 LAB — CBG MONITORING, ED: Glucose-Capillary: 112 mg/dL — ABNORMAL HIGH (ref 70–99)

## 2021-12-07 LAB — URINALYSIS, ROUTINE W REFLEX MICROSCOPIC
Bilirubin Urine: NEGATIVE
Glucose, UA: NEGATIVE mg/dL
Hgb urine dipstick: NEGATIVE
Ketones, ur: NEGATIVE mg/dL
Leukocytes,Ua: NEGATIVE
Nitrite: NEGATIVE
Protein, ur: NEGATIVE mg/dL
Specific Gravity, Urine: 1.015 (ref 1.005–1.030)
pH: 5 (ref 5.0–8.0)

## 2021-12-07 LAB — BASIC METABOLIC PANEL
Anion gap: 11 (ref 5–15)
BUN: 13 mg/dL (ref 8–23)
CO2: 22 mmol/L (ref 22–32)
Calcium: 9 mg/dL (ref 8.9–10.3)
Chloride: 106 mmol/L (ref 98–111)
Creatinine, Ser: 1.41 mg/dL — ABNORMAL HIGH (ref 0.61–1.24)
GFR, Estimated: 57 mL/min — ABNORMAL LOW (ref >60)
Glucose, Bld: 106 mg/dL — ABNORMAL HIGH (ref 70–99)
Potassium: 3.7 mmol/L (ref 3.5–5.1)
Sodium: 139 mmol/L (ref 135–145)

## 2021-12-07 LAB — TROPONIN I (HIGH SENSITIVITY): Troponin I (High Sensitivity): 11 ng/L (ref <18)

## 2021-12-07 MED ORDER — IOHEXOL 350 MG/ML SOLN
75.0000 mL | Freq: Once | INTRAVENOUS | Status: AC | PRN
  Administered 2021-12-07: 75 mL via INTRAVENOUS

## 2021-12-07 NOTE — ED Triage Notes (Signed)
Patient had a syncopal event last night. Became nauseous and while enroute to bathroom passed out striking forehead and busted lip. Complains of headache, facial pain. Nausea resolved. Does complain of blurred vision with same. ambulatory
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
## 2021-12-07 NOTE — ED Provider Triage Note (Signed)
Emergency Medicine Provider Triage Evaluation Note  Lance Mills , a 61 y.o. male  was evaluated in triage.  Pt complains of syncope and head injury.  This occurred last night.  Patient was watching TV and started feel short breath.  He got up to go to the bathroom felt lightheaded all of a sudden and then passed out striking his head on his dresser.  Endorses loss of consciousness that lasted maybe a couple seconds.  Vomited x 1.  States that he had visual disturbance in both eyes since this occurred last night.  I plan to go to the [place] for evaluation but they advised him to come here.  Denies gait ataxia.  Endorses headache.  Denies chest pain.  Also states that his neck is tender.  Review of Systems  Positive: See above Negative: See above  Physical Exam  BP (!) 151/120 (BP Location: Left Arm)   Pulse 94   Temp 99 F (37.2 C) (Oral)   Resp 16   SpO2 100%  Gen:   Awake, no distress   Resp:  Normal effort  MSK:   Moves extremities without difficulty  Other:  Multiple superficial abrasions noted on the forehead.  Laceration to the lower inner part of the lip.  Cervical midline tenderness.  Medical Decision Making  Medically screening exam initiated at 10:05 AM.  Appropriate orders placed.  Jeanie Sewer was informed that the remainder of the evaluation will be completed by another provider, this initial triage assessment does not replace that evaluation, and the importance of remaining in the ED until their evaluation is complete.  Work up [employer], Sharlett Iles, PA-C 12/07/21 1008

## 2021-12-24 ENCOUNTER — Encounter: Payer: Self-pay | Admitting: Internal Medicine

## 2021-12-24 ENCOUNTER — Ambulatory Visit: Payer: Medicare Other | Attending: Internal Medicine | Admitting: Internal Medicine

## 2021-12-24 VITALS — BP 122/86 | HR 93 | Ht 73.0 in | Wt 220.4 lb

## 2021-12-24 DIAGNOSIS — I1A0 Resistant hypertension: Secondary | ICD-10-CM | POA: Diagnosis present

## 2021-12-24 DIAGNOSIS — Z8249 Family history of ischemic heart disease and other diseases of the circulatory system: Secondary | ICD-10-CM | POA: Diagnosis not present

## 2021-12-24 DIAGNOSIS — Z79899 Other long term (current) drug therapy: Secondary | ICD-10-CM | POA: Insufficient documentation

## 2021-12-24 NOTE — Patient Instructions (Signed)
Medication Instructions:   *If you need a refill on your cardiac medications before your next appointment, please call your pharmacy*   Lab Work: NMR, APO B, LIPO A, CMET, CBC, PRO BNP If you have labs (blood work) drawn today and your tests are completely normal, you will receive your results only by: MyChart Message (if you have MyChart) OR A paper copy in the mail If you have any lab test that is abnormal or we need to change your treatment, we will call you to review the results.   Testing/Procedures:    Follow-Up: At [facility], you and your health needs are our priority.  As part of our continuing mission to provide you with exceptional heart care, we have created designated Provider Care Teams.  These Care Teams include your primary Cardiologist (physician) and Advanced Practice Providers (APPs -  Physician Assistants and Nurse Practitioners) who all work together to provide you with the care you need, when you need it.  We recommend signing up for the patient portal called "MyChart".  Sign up information is provided on this After Visit Summary.  MyChart is used to connect with patients for Virtual Visits (Telemedicine).  Patients are able to view lab/test results, encounter notes, upcoming appointments, etc.  Non-urgent messages can be sent to your provider as well.   To learn more about what you can do with MyChart, go to [email or web address].     Other Instructions   Important Information About Sugar

## 2021-12-24 NOTE — Progress Notes (Unsigned)
Cardiology Office Note   Date:  12/24/2021   ID:  Lance Mills, DOB [birth year], MRN [id number]  PCP:  Default, Provider, MD  Cardiologist:   Dietrich Pates, MD   Patient referred for evaluation of snycop   History of Present Illness: Lance Mills is a 61 y.o. male with a history of  HTN, T2DM, He was seen in ER on 11/26 for evaluation of syncope the day prior The pt says he was feeling fine earlier in day    had breakfast of frut    At 1PM had dinner (rabbit, bread)   Helped daughter in [place].  Came home   At 8 PM was watching TV   Then 1/2 way through game was sitting   Felt like he was choking   Sudden   Had not had anything to eat or drink to cause it   Got frantic   Alone   Took a big breatth to force air in.Was able to breathe   Then went to [facility] on toilet  That is where he got weak, sweaty and passed out.   WOke up a few min later    Went back to watch TV  Called PCP the following day   Told to go to ER  CT chest neg for PE  Did have diffuse bronchial thickend and fine centrilobular nodularity.   Cardiomegaly and CAD also noted   Coarse contour of liver consistent with cirrhosis.         Since then he has had no further spells   No CP   NO dizziness   No choking sensation   No wheezing    No palpitations      Patinet had a normal myoview in 2013    Current Meds  Medication Sig   naproxen (NAPROSYN) 500 MG tablet Take 500 mg by mouth 2 (two) times daily with a meal.   traMADol (ULTRAM) 50 MG tablet Take 50 mg by mouth every 6 (six) hours as needed. For pain     Allergies:   Amoxicillin, Biaxin [clarithromycin], Eggs or egg-derived products, Other, and Penicillins   Past Medical History:  Diagnosis Date   HTN (hypertension)    b/p meds stopped May 2013 "low B/P"    Past Surgical History:  Procedure Laterality Date   KNEE SURGERY  1997   Lt TKR     Social History:  The patient  reports that he has never smoked. He does not have any smokeless  tobacco history on file. He reports current drug use. Drug: Marijuana. He reports that he does not drink alcohol.   Family History:  The patient's family history includes Coronary artery disease (age of onset: 35) in his father.    ROS:  Please see the history of present illness. All other systems are reviewed and  Negative to the above problem except as noted.    PHYSICAL EXAM: VS:  BP 122/86   Pulse 93   Ht 6\' 1"  (1.854 m)   Wt 220 lb 6.4 oz (100 kg)   SpO2 99%   BMI 29.08 kg/m   GEN: Overweight 61 yo , in no acute distress  HEENT: normal  Neck: no JVD, carotid bruits, Cardiac: RRR; no murmurs,  No LE  edema  Respiratory:  clear to auscultation bilaterally, GI: soft, nontender, nondistended, + BS  No hepatomegaly  MS: no deformity Moving all extremities   Skin: warm and dry, no rash Neuro:  Strength and sensation are intact Psych: euthymic mood, full affect   EKG:  EKG is not ordered today.  On 11/29   SR   First degree AV block   PR interval 248 msec.    Incomp RBBB   Lipid Panel    Component Value Date/Time   CHOL 206 (H) 09/23/2011 1809   TRIG 50 09/23/2011 1809   HDL 82 09/23/2011 1809   CHOLHDL 2.5 09/23/2011 1809   VLDL 10 09/23/2011 1809   LDLCALC 114 (H) 09/23/2011 1809      Wt Readings from Last 3 Encounters:  12/24/21 220 lb 6.4 oz (100 kg)  09/24/11 202 lb (91.6 kg)      ASSESSMENT AND PLAN:  1  Syncope    Pt with episdoe of syncope after "spell" when the thought he was choking     History suggests it may have been initial event that contributed to feeling bad and then vagal type reaction.    I am not convinced of an arrhythmia  2"Choking " spell   Not sure what this was caused by   No food or fluid ingestion   COuld it be angina or an arrhtymia .   He does have CAD on CT scan   CT alos sugg edema or some pulmomary precesses.    Note Trop were neg the next day   Will get labs  Consider a CT coronary angiogram  3 Lipoids   Pt with CAD on CT   Will  get lipoids    4  DM   REviewed diet   Cut back on carbs     Current medicines are reviewed at length with the patient today.  The patient does not have concerns regarding medicines.  Signed, Dietrich Pates, MD  12/24/2021 2:28 PM    [facility] [address] [place], [place], [place]  [address] Phone: [phone]; Fax: [phone]

## 2021-12-25 LAB — COMPREHENSIVE METABOLIC PANEL
ALT: 35 IU/L (ref 0–44)
AST: 25 IU/L (ref 0–40)
Albumin/Globulin Ratio: 1.7 (ref 1.2–2.2)
Albumin: 4.4 g/dL (ref 3.9–4.9)
Alkaline Phosphatase: 72 IU/L (ref 44–121)
BUN/Creatinine Ratio: 10 (ref 10–24)
BUN: 13 mg/dL (ref 8–27)
Bilirubin Total: 0.6 mg/dL (ref 0.0–1.2)
CO2: 22 mmol/L (ref 20–29)
Calcium: 9.4 mg/dL (ref 8.6–10.2)
Chloride: 106 mmol/L (ref 96–106)
Creatinine, Ser: 1.26 mg/dL (ref 0.76–1.27)
Globulin, Total: 2.6 g/dL (ref 1.5–4.5)
Glucose: 81 mg/dL (ref 70–99)
Potassium: 4 mmol/L (ref 3.5–5.2)
Sodium: 141 mmol/L (ref 134–144)
Total Protein: 7 g/dL (ref 6.0–8.5)
eGFR: 65 mL/min/{1.73_m2} (ref >59)

## 2021-12-25 LAB — NMR, LIPOPROFILE
Cholesterol, Total: 200 mg/dL — ABNORMAL HIGH (ref 100–199)
HDL Particle Number: 32.7 umol/L (ref >=30.5)
HDL-C: 79 mg/dL (ref >39)
LDL Particle Number: 1174 nmol/L — ABNORMAL HIGH (ref <1000)
LDL Size: 21.5 nm (ref >20.5)
LDL-C (NIH Calc): 109 mg/dL — ABNORMAL HIGH (ref 0–99)
LP-IR Score: 29 (ref <=45)
Small LDL Particle Number: <90 nmol/L (ref <=527)
Triglycerides: 66 mg/dL (ref 0–149)

## 2021-12-25 LAB — APOLIPOPROTEIN B: Apolipoprotein B: 89 mg/dL (ref <90)

## 2021-12-25 LAB — LIPOPROTEIN A (LPA): Lipoprotein (a): 36.3 nmol/L (ref <75.0)

## 2021-12-25 LAB — CBC
Hematocrit: 40.9 % (ref 37.5–51.0)
Hemoglobin: 13.2 g/dL (ref 13.0–17.7)
MCH: 28.6 pg (ref 26.6–33.0)
MCHC: 32.3 g/dL (ref 31.5–35.7)
MCV: 89 fL (ref 79–97)
Platelets: 279 10*3/uL (ref 150–450)
RBC: 4.61 x10E6/uL (ref 4.14–5.80)
RDW: 12.6 % (ref 11.6–15.4)
WBC: 5.6 10*3/uL (ref 3.4–10.8)

## 2021-12-25 LAB — PRO B NATRIURETIC PEPTIDE: NT-Pro BNP: 79 pg/mL (ref 0–210)

## 2021-12-30 ENCOUNTER — Telehealth: Payer: Self-pay

## 2021-12-30 DIAGNOSIS — Z8249 Family history of ischemic heart disease and other diseases of the circulatory system: Secondary | ICD-10-CM

## 2021-12-30 DIAGNOSIS — Z79899 Other long term (current) drug therapy: Secondary | ICD-10-CM

## 2021-12-30 MED ORDER — ROSUVASTATIN CALCIUM 20 MG PO TABS: 20.0000 mg | ORAL_TABLET | Freq: Every day | ORAL | 3 refills | Status: DC

## 2021-12-30 NOTE — Telephone Encounter (Signed)
Pt verbalized understanding of his lab results... will have fasting labs 03/03/22.

## 2021-12-30 NOTE — Telephone Encounter (Signed)
-----   Message from Dietrich Pates V, MD sent at 12/29/2021  5:04 PM EST ----- With evid of atherosclerosis   LDL needs to be below 70    I would add Crestor 20 mg    Follow up lipomed in 8 wks with Liver panel

## 2022-03-03 ENCOUNTER — Ambulatory Visit: Payer: Medicare Other | Attending: Internal Medicine

## 2022-03-03 DIAGNOSIS — Z8249 Family history of ischemic heart disease and other diseases of the circulatory system: Secondary | ICD-10-CM

## 2022-03-03 DIAGNOSIS — Z79899 Other long term (current) drug therapy: Secondary | ICD-10-CM

## 2022-03-04 LAB — NMR, LIPOPROFILE
Cholesterol, Total: 211 mg/dL — ABNORMAL HIGH (ref 100–199)
HDL Particle Number: 34 umol/L (ref >=30.5)
HDL-C: 69 mg/dL (ref >39)
LDL Particle Number: 1462 nmol/L — ABNORMAL HIGH (ref <1000)
LDL Size: 21.5 nm (ref >20.5)
LDL-C (NIH Calc): 127 mg/dL — ABNORMAL HIGH (ref 0–99)
LP-IR Score: 30 (ref <=45)
Small LDL Particle Number: 245 nmol/L (ref <=527)
Triglycerides: 87 mg/dL (ref 0–149)

## 2022-03-04 LAB — HEPATIC FUNCTION PANEL
ALT: 33 IU/L (ref 0–44)
AST: 27 IU/L (ref 0–40)
Albumin: 4.4 g/dL (ref 3.9–4.9)
Alkaline Phosphatase: 77 IU/L (ref 44–121)
Bilirubin Total: 0.3 mg/dL (ref 0.0–1.2)
Bilirubin, Direct: 0.14 mg/dL (ref 0.00–0.40)
Total Protein: 7.3 g/dL (ref 6.0–8.5)

## 2022-03-09 ENCOUNTER — Telehealth: Payer: Self-pay

## 2022-03-09 DIAGNOSIS — I1A0 Resistant hypertension: Secondary | ICD-10-CM

## 2022-03-09 DIAGNOSIS — Z8249 Family history of ischemic heart disease and other diseases of the circulatory system: Secondary | ICD-10-CM

## 2022-03-09 DIAGNOSIS — Z79899 Other long term (current) drug therapy: Secondary | ICD-10-CM

## 2022-03-09 MED ORDER — EZETIMIBE 10 MG PO TABS: 10.0000 mg | ORAL_TABLET | Freq: Every day | ORAL | 3 refills | Status: DC

## 2022-03-09 NOTE — Telephone Encounter (Signed)
-----   Message from Dorris Carnes V, MD sent at 03/08/2022  2:11 PM EST ----- LDL Is still too high given CAD  Keep on Crestor   Add Zetia 10 mg      Follow up lipomed and liver panel in 8 wks

## 2022-03-09 NOTE — Telephone Encounter (Signed)
Orders received from Dr. Harrington Challenger in Results review.    Called patient to share LDL results, to tell him to continue Crestor, but that we will be adding Zetia 10 mg, and that Dr. Harrington Challenger wants f/u with Liver panel in 8 weeks.    I left a message for him to call [facility] back to go over this in detail.  F/u still required.

## 2022-03-09 NOTE — Telephone Encounter (Signed)
The patient has been notified of the result and verbalized understanding.  All questions (if any) were answered. Bernestine Amass, RN 03/09/2022 4:20 PM   Rx for zetia has been sent in. Labs have been scheduled.

## 2022-05-08 ENCOUNTER — Ambulatory Visit: Payer: No Typology Code available for payment source

## 2022-06-12 ENCOUNTER — Inpatient Hospital Stay (HOSPITAL_COMMUNITY)
Admission: EM | Admit: 2022-06-12 | Discharge: 2022-06-17 | DRG: 244 | Disposition: A | Discharge status: Home / Self Care | Payer: No Typology Code available for payment source | Source: Ambulatory Visit | Attending: Internal Medicine | Admitting: Internal Medicine

## 2022-06-12 ENCOUNTER — Inpatient Hospital Stay (HOSPITAL_COMMUNITY): Payer: No Typology Code available for payment source

## 2022-06-12 ENCOUNTER — Encounter (HOSPITAL_COMMUNITY): Payer: Self-pay

## 2022-06-12 ENCOUNTER — Emergency Department (HOSPITAL_COMMUNITY): Payer: No Typology Code available for payment source

## 2022-06-12 DIAGNOSIS — Z79899 Other long term (current) drug therapy: Secondary | ICD-10-CM

## 2022-06-12 DIAGNOSIS — Z8249 Family history of ischemic heart disease and other diseases of the circulatory system: Secondary | ICD-10-CM

## 2022-06-12 DIAGNOSIS — Z881 Allergy status to other antibiotic agents status: Secondary | ICD-10-CM

## 2022-06-12 DIAGNOSIS — I442 Atrioventricular block, complete: Principal | ICD-10-CM | POA: Diagnosis present

## 2022-06-12 DIAGNOSIS — I4460 Unspecified fascicular block: Secondary | ICD-10-CM | POA: Diagnosis not present

## 2022-06-12 DIAGNOSIS — K7460 Unspecified cirrhosis of liver: Secondary | ICD-10-CM | POA: Diagnosis present

## 2022-06-12 DIAGNOSIS — R079 Chest pain, unspecified: Secondary | ICD-10-CM | POA: Diagnosis present

## 2022-06-12 DIAGNOSIS — R008 Other abnormalities of heart beat: Secondary | ICD-10-CM | POA: Diagnosis not present

## 2022-06-12 DIAGNOSIS — I10 Essential (primary) hypertension: Secondary | ICD-10-CM | POA: Diagnosis present

## 2022-06-12 DIAGNOSIS — Z880 Allergy status to penicillin: Secondary | ICD-10-CM

## 2022-06-12 DIAGNOSIS — E119 Type 2 diabetes mellitus without complications: Secondary | ICD-10-CM | POA: Diagnosis present

## 2022-06-12 DIAGNOSIS — I459 Conduction disorder, unspecified: Secondary | ICD-10-CM | POA: Diagnosis present

## 2022-06-12 DIAGNOSIS — Z885 Allergy status to narcotic agent status: Secondary | ICD-10-CM

## 2022-06-12 DIAGNOSIS — I2510 Atherosclerotic heart disease of native coronary artery without angina pectoris: Secondary | ICD-10-CM | POA: Diagnosis present

## 2022-06-12 DIAGNOSIS — R002 Palpitations: Principal | ICD-10-CM

## 2022-06-12 DIAGNOSIS — Z888 Allergy status to other drugs, medicaments and biological substances status: Secondary | ICD-10-CM | POA: Diagnosis not present

## 2022-06-12 LAB — CBC
HCT: 41.7 % (ref 39.0–52.0)
HCT: 41.2 % (ref 39.0–52.0)
Hemoglobin: 13.4 g/dL (ref 13.0–17.0)
Hemoglobin: 12.9 g/dL — ABNORMAL LOW (ref 13.0–17.0)
MCH: 28.3 pg (ref 26.0–34.0)
MCH: 27.7 pg (ref 26.0–34.0)
MCHC: 32.1 g/dL (ref 30.0–36.0)
MCHC: 31.3 g/dL (ref 30.0–36.0)
MCV: 88 fL (ref 80.0–100.0)
MCV: 88.6 fL (ref 80.0–100.0)
Platelets: 323 10*3/uL (ref 150–400)
Platelets: 283 10*3/uL (ref 150–400)
RBC: 4.74 MIL/uL (ref 4.22–5.81)
RBC: 4.65 MIL/uL (ref 4.22–5.81)
RDW: 13.6 % (ref 11.5–15.5)
RDW: 13.6 % (ref 11.5–15.5)
WBC: 4.6 10*3/uL (ref 4.0–10.5)
WBC: 4.5 10*3/uL (ref 4.0–10.5)
nRBC: 0 % (ref 0.0–0.2)
nRBC: 0 % (ref 0.0–0.2)

## 2022-06-12 LAB — BASIC METABOLIC PANEL
Anion gap: 11 (ref 5–15)
BUN: 11 mg/dL (ref 8–23)
CO2: 21 mmol/L — ABNORMAL LOW (ref 22–32)
Calcium: 8.8 mg/dL — ABNORMAL LOW (ref 8.9–10.3)
Chloride: 104 mmol/L (ref 98–111)
Creatinine, Ser: 1.39 mg/dL — ABNORMAL HIGH (ref 0.61–1.24)
GFR, Estimated: 58 mL/min — ABNORMAL LOW (ref >60)
Glucose, Bld: 95 mg/dL (ref 70–99)
Potassium: 3.5 mmol/L (ref 3.5–5.1)
Sodium: 136 mmol/L (ref 135–145)

## 2022-06-12 LAB — TROPONIN I (HIGH SENSITIVITY)
Troponin I (High Sensitivity): 13 ng/L (ref <18)
Troponin I (High Sensitivity): 10 ng/L (ref <18)

## 2022-06-12 MED ORDER — RISAQUAD PO CAPS
1.0000 | ORAL_CAPSULE | Freq: Every day | ORAL | Status: DC
  Administered 2022-06-13 – 2022-06-17 (×3): 1 via ORAL
  Filled 2022-06-12 (×4): qty 1

## 2022-06-12 MED ORDER — SODIUM CHLORIDE 0.9% FLUSH
3.0000 mL | Freq: Two times a day (BID) | INTRAVENOUS | Status: DC
  Administered 2022-06-13 – 2022-06-16 (×7): 3 mL via INTRAVENOUS

## 2022-06-12 MED ORDER — ACETAMINOPHEN 325 MG PO TABS
650.0000 mg | ORAL_TABLET | ORAL | Status: DC | PRN
  Administered 2022-06-13 – 2022-06-14 (×2): 650 mg via ORAL
  Filled 2022-06-12 (×2): qty 2

## 2022-06-12 MED ORDER — SODIUM CHLORIDE 0.9% FLUSH: 3.0000 mL | INTRAVENOUS | Status: DC | PRN

## 2022-06-12 MED ORDER — DIPHENOXYLATE-ATROPINE 2.5-0.025 MG PO TABS: 1.0000 | ORAL_TABLET | Freq: Four times a day (QID) | ORAL | Status: DC | PRN

## 2022-06-12 MED ORDER — ATORVASTATIN CALCIUM 40 MG PO TABS
40.0000 mg | ORAL_TABLET | Freq: Every day | ORAL | Status: DC
  Administered 2022-06-13 – 2022-06-17 (×3): 40 mg via ORAL
  Filled 2022-06-12 (×3): qty 1

## 2022-06-12 MED ORDER — ENOXAPARIN SODIUM 40 MG/0.4ML IJ SOSY
40.0000 mg | PREFILLED_SYRINGE | INTRAMUSCULAR | Status: DC
  Administered 2022-06-13 – 2022-06-14 (×3): 40 mg via SUBCUTANEOUS
  Filled 2022-06-12 (×3): qty 0.4

## 2022-06-12 MED ORDER — SODIUM CHLORIDE 0.9 % IV SOLN: 250.0000 mL | INTRAVENOUS | Status: DC | PRN

## 2022-06-12 MED ORDER — ONDANSETRON HCL 4 MG/2ML IJ SOLN: 4.0000 mg | Freq: Four times a day (QID) | INTRAMUSCULAR | Status: DC | PRN

## 2022-06-12 NOTE — H&P (Signed)
Cardiology Admission History and Physical   Patient ID: AZAIAH KELLISON MRN: [id number]; DOB: 1960-07-17   Admission date: 06/12/2022  PCP:  Clinic, [facility] Providers Cardiologist:  None        Chief Complaint:  abnormal ziopatch  Patient Profile:   Lance Mills is a 62 y.o. male with past medical history of hypertension, type 2 diabetes, coronary artery calcification on CT, liver cirrhosis on CT  and ILD CT picturerecent evaluation for syncope in November 2023 who is being seen 06/12/2022 for the evaluation of abnormal Zio patch.  Patient was wearing Zio patch as part of a workup of syncope back in November 2023.  Patient was wearing it for 2 weeks and mail it back on May 21.  Exact results are not known to me at this time.  History of Present Illness:   Mr. Panther is a very pleasant African-American male who is accompanied by his daughter.  Patient reports episodes of presyncope, worsening exertional dyspnea, lightheadedness over the last 3 to 4 months.  Earlier patient described an episode of hematemesis, small amount.  He denies any prior episodes.  Patient denies any fevers chills.  Patient [place] any sick contacts.  Patient denies any recent travel.   Past Medical History:  Diagnosis Date   HTN (hypertension)    b/p meds stopped May 2013 "low B/P"    Past Surgical History:  Procedure Laterality Date   KNEE SURGERY  1997   Lt TKR     Medications Prior to Admission: Prior to Admission medications   Medication Sig Start Date End Date Taking? Authorizing Provider  ACIDOPHILUS LACTOBACILLUS PO Take 1 tablet by mouth daily.   Yes [provider]  amLODipine (NORVASC) 5 MG tablet Take 5 mg by mouth daily.   Yes [provider]  atorvastatin (LIPITOR) 40 MG tablet Take 40 mg by mouth at bedtime.   Yes [provider]  Cholecalciferol (VITAMIN D-3 PO) Take 1 tablet by mouth daily.   Yes [provider]   diphenoxylate-atropine (LOMOTIL) 2.5-0.025 MG tablet Take 1 tablet by mouth 4 (four) times daily as needed for diarrhea or loose stools. PRN   Yes [provider]  EPINEPHrine (EPIPEN 2-PAK) 0.3 mg/0.3 mL IJ SOAJ injection Inject 0.3 mg into the muscle as needed for anaphylaxis. PRN   Yes [provider]  furosemide (LASIX) 20 MG tablet Take 10 mg by mouth daily. For signs of fluid overload, such as weight gain of 3 lbs overnight or 5 lbs in a week.   Yes [provider]  MENTHOL-METHYL SALICYLATE EX Apply 1 application  topically 4 (four) times daily as needed (Use as needed for pain).   Yes [provider]  sildenafil (VIAGRA) 100 MG tablet Take 100 mg by mouth daily as needed for erectile dysfunction. Take 1 tablet by mouth as instructed *Take 1 hour prior to sexual activity, do not exceed 1 dose per 24 hours*   Yes [provider]  Skin Protectants, Misc. (EUCERIN) cream Apply 1 Application topically as needed for dry skin. Apply a thin layer to affected area daily as needed for dry skin.   Yes [provider]     Allergies:    Allergies  Allergen Reactions   Amoxicillin    Biaxin [Clarithromycin]    Egg-Derived Products    Flomax [Tamsulosin]    Midazolam Other (See Comments)   Nitroglycerin Er    Other  Flu vaccine   Penicillins    Toradol [Ketorolac Tromethamine]    Morphine Nausea And Vomiting, Other (See Comments) and Rash    Reaction: Sweating (intolerance)    Social History:   Social History   Socioeconomic History   Marital status: Married    Spouse name: Not on file   Number of children: Not on file   Years of education: Not on file   Highest education level: Not on file  Occupational History   Occupation: professor    Employer: [employer]    Comment: Sociology  Tobacco Use   Smoking status: Never   Smokeless tobacco: Not on file  Substance and Sexual Activity   Alcohol use: Not Currently   Drug  use: Yes    Types: Marijuana   Sexual activity: Not on file  Other Topics Concern   Not on file  Social History Narrative   Not on file   Social Determinants of Health   Financial Resource Strain: Not on file  Food Insecurity: Not on file  Transportation Needs: Not on file  Physical Activity: Not on file  Stress: Not on file  Social Connections: Not on file  Intimate Partner Violence: Not on file    Family History:   The patient's family history includes Coronary artery disease (age of onset: 87) in his father.    ROS:  Please see the history of present illness.  All other ROS reviewed and negative.     Physical Exam/Data:   Vitals:   06/12/22 1725 06/12/22 1726 06/12/22 2111 06/12/22 2113  BP:  (!) 133/92 (!) 133/103   Pulse:   86   Resp:   18   Temp:    98.9 F (37.2 C)  TempSrc:    Oral  SpO2:   100%   Weight: 98.9 kg     Height: 6\' 1"  (1.854 m)      No intake or output data in the 24 hours ending 06/12/22 2220    06/12/2022    5:25 PM 12/24/2021    1:59 PM 09/24/2011    9:11 PM  Last 3 Weights  Weight (lbs) 218 lb 220 lb 6.4 oz 202 lb  Weight (kg) 98.884 kg 99.973 kg 91.627 kg     Body mass index is 28.76 kg/m.  General:  Well nourished, well developed, in no acute distress HEENT: normal Neck: no JVD Vascular: No carotid bruits; Distal pulses 2+ bilaterally   Cardiac:  normal S1, S2; RRR; no murmur  Lungs:  clear to auscultation bilaterally, no wheezing, rhonchi or rales  Abd: soft, nontender, no hepatomegaly  Ext: no edema Musculoskeletal:  No deformities, BUE and BLE strength normal and equal Skin: warm and dry  Neuro:  CNs 2-12 intact, no focal abnormalities noted Psych:  Normal affect    EKG:  The ECG that was done 06/12/2022 was personally reviewed and demonstrates normal sinus rhythm, right axis deviation and poor R wave progression  Relevant CV Studies: Stress test with Myoview back in 2013 normal  Laboratory Data:  High Sensitivity  Troponin:   Recent Labs  Lab 06/12/22 1735 06/12/22 1950  TROPONINIHS 13 10      Chemistry Recent Labs  Lab 06/12/22 1735  NA 136  K 3.5  CL 104  CO2 21*  GLUCOSE 95  BUN 11  CREATININE 1.39*  CALCIUM 8.8*  GFRNONAA 58*  ANIONGAP 11    No results for input(s): "PROT", "ALBUMIN", "AST", "ALT", "ALKPHOS", "BILITOT" in  the last 168 hours. Lipids No results for input(s): "CHOL", "TRIG", "HDL", "LABVLDL", "LDLCALC", "CHOLHDL" in the last 168 hours. Hematology Recent Labs  Lab 06/12/22 1735  WBC 4.6  RBC 4.74  HGB 13.4  HCT 41.7  MCV 88.0  MCH 28.3  MCHC 32.1  RDW 13.6  PLT 323   Thyroid No results for input(s): "TSH", "FREET4" in the last 168 hours. BNPNo results for input(s): "BNP", "PROBNP" in the last 168 hours.  DDimer No results for input(s): "DDIMER" in the last 168 hours.   Radiology/Studies:  DG Chest 2 View  Result Date: 06/12/2022 CLINICAL DATA:  Chest pain EXAM: CHEST - 2 VIEW COMPARISON:  09/23/2011 x-ray.  CT angiogram 12/07/2021 FINDINGS: Neurostimulator device with leads along the lower thoracic spine. No consolidation, pneumothorax or effusion. No edema. Normal cardiopericardial silhouette. Tortuous and ectatic aorta. Eventration of the right hemidiaphragm. IMPRESSION: No acute cardiopulmonary disease.  Neurostimulator. Electronically Signed   By: Karen Kays M.D.   On: 06/12/2022 18:20     Assessment and Plan:  62 year old African-American male presented to the hospital following returning results of his Zio patch.  I do not think patient has any indication for emergent pacemaker.  Overall I think this is an acute recognition of the chronic problem.  He return his Zio patch on May 21 and then did not have progression of symptoms since.  He is symptomatic with dizziness and worsening exercise tolerance as well as presyncope. 1.  Suspected heart block.  Please obtain records from [place] particularly Zio patch.  If unable please to obtain please call zio and  requests results to be faxed.  If he indeed has a heart block he is younger than typical patient with degenerative conduction problem.  Will proceed with TTE as well as cardiac MRI to rule out infiltrative disease.  Continuous telemetry monitoring. Will avoid AV nodal drugs at this time.  2. Episode of hemoptysis. Known lung changes based on prior CT.  Known liver cirrhosisBased on prior CT No documented history of varices. Fu HRCT. Consider liver US as an outpt Fu with pulm as an outpt Patient follows with GI for his diarrhea, please refer for further evaluation of possible cirrhosis   Risk Assessment/Risk Scores:           Severity of Illness: The appropriate patient status for this patient is INPATIENT. Inpatient status is judged to be reasonable and necessary in order to provide the required intensity of service to ensure the patient's safety. The patient's presenting symptoms, physical exam findings, and initial radiographic and laboratory data in the context of their chronic comorbidities is felt to place them at high risk for further clinical deterioration. Furthermore, it is not anticipated that the patient will be medically stable for discharge from the hospital within 2 midnights of admission.   * I certify that at the point of admission it is my clinical judgment that the patient will require inpatient hospital care spanning beyond 2 midnights from the point of admission due to high intensity of service, high risk for further deterioration and high frequency of surveillance required.*   For questions or updates, please contact [facility] Please consult [email or web address] for contact info under     Signed, Lenon Oms, MD  06/12/2022 10:20 PM

## 2022-06-12 NOTE — ED Notes (Signed)
ED TO INPATIENT HANDOFF REPORT  ED Nurse Name and Phone #:   S Name/Age/Gender Lance Mills 62 y.o. male Room/Bed: 012C/012C  Code Status   Code Status: Full Code  Home/SNF/Other Home Patient oriented to: self, place, time, and situation Is this baseline? Yes   Triage Complete: Triage complete  Chief Complaint Heart block [I45.9]  Triage Note Pt sent by cardiology at [facility] to see Dr Elberta Fortis (EP) and Dr Tenny Craw (cards). Pt has been wearing a monitor for a "month".  Pt c/o "light pressure and pain in my chest". Pt states he had hemoptysis yesterday. Pt has had lightheadedness and dizziness yesterday when coughing.      Allergies Allergies  Allergen Reactions   Amoxicillin    Biaxin [Clarithromycin]    Egg-Derived Products    Flomax [Tamsulosin]    Midazolam Other (See Comments)   Nitroglycerin Er    Other     Flu vaccine   Penicillins    Toradol [Ketorolac Tromethamine]    Morphine Nausea And Vomiting, Other (See Comments) and Rash    Reaction: Sweating (intolerance)    Level of Care/Admitting Diagnosis ED Disposition     ED Disposition  Admit   Condition  --   Comment  Hospital Area: [facility] [[id number]]  Level of Care: Telemetry Cardiac [[id number]]  May admit patient to [facility] or [facility] if equivalent level of care is available:: Yes  Covid Evaluation: Asymptomatic - no recent exposure (last 10 days) testing not required  Diagnosis: Heart block [841324]  Admitting Physician: Lenon Oms [[id number]]  Attending Physician: Pricilla Riffle [[id number]]  Certification:: I certify this patient will need inpatient services for at least 2 midnights          B Medical/Surgery History Past Medical History:  Diagnosis Date   HTN (hypertension)    b/p meds stopped May 2013 "low B/P"   Past Surgical History:  Procedure Laterality Date   KNEE SURGERY  1997   Lt TKR     A IV Location/Drains/Wounds Patient  Lines/Drains/Airways Status     Active Line/Drains/Airways     Name Placement date Placement time Site Days   Wound 09/27/11 Other (Comment) Shoulder Right;Posterior raised, hard area  09/27/11  0800  Shoulder  3911            Intake/Output Last 24 hours No intake or output data in the 24 hours ending 06/12/22 2221  Labs/Imaging Results for orders placed or performed during the hospital encounter of 06/12/22 (from the past 48 hour(s))  Basic metabolic panel     Status: Abnormal   Collection Time: 06/12/22  5:35 PM  Result Value Ref Range   Sodium 136 135 - 145 mmol/L   Potassium 3.5 3.5 - 5.1 mmol/L   Chloride 104 98 - 111 mmol/L   CO2 21 (L) 22 - 32 mmol/L   Glucose, Bld 95 70 - 99 mg/dL    Comment: Glucose reference range applies only to samples taken after fasting for at least 8 hours.   BUN 11 8 - 23 mg/dL   Creatinine, Ser 5.36 (H) 0.61 - 1.24 mg/dL   Calcium 8.8 (L) 8.9 - 10.3 mg/dL   GFR, Estimated 58 (L) >60 mL/min    Comment: (NOTE) Calculated using the CKD-EPI Creatinine Equation (2021)    Anion gap 11 5 - 15    Comment: Performed at [facility], [address], [place], [place] [address]  CBC  Status: None   Collection Time: 06/12/22  5:35 PM  Result Value Ref Range   WBC 4.6 4.0 - 10.5 K/uL   RBC 4.74 4.22 - 5.81 MIL/uL   Hemoglobin 13.4 13.0 - 17.0 g/dL   HCT 16.1 09.6 - 04.5 %   MCV 88.0 80.0 - 100.0 fL   MCH 28.3 26.0 - 34.0 pg   MCHC 32.1 30.0 - 36.0 g/dL   RDW 40.9 81.1 - 91.4 %   Platelets 323 150 - 400 K/uL   nRBC 0.0 0.0 - 0.2 %    Comment: Performed at [facility], [address] [place], [place] [address]  Troponin I (High Sensitivity)     Status: None   Collection Time: 06/12/22  5:35 PM  Result Value Ref Range   Troponin I (High Sensitivity) 13 <18 ng/L    Comment: (NOTE) Elevated high sensitivity troponin I (hsTnI) values and significant  changes across serial measurements may suggest ACS but many other  chronic and  acute conditions are known to elevate hsTnI results.  Refer to the "Links" section for chest pain algorithms and additional  guidance. Performed at [facility], [address] [place], [place] [address]   Troponin I (High Sensitivity)     Status: None   Collection Time: 06/12/22  7:50 PM  Result Value Ref Range   Troponin I (High Sensitivity) 10 <18 ng/L    Comment: (NOTE) Elevated high sensitivity troponin I (hsTnI) values and significant  changes across serial measurements may suggest ACS but many other  chronic and acute conditions are known to elevate hsTnI results.  Refer to the "Links" section for chest pain algorithms and additional  guidance. Performed at [facility], [address], [place], [place] [address]    DG Chest 2 View  Result Date: 06/12/2022 CLINICAL DATA:  Chest pain EXAM: CHEST - 2 VIEW COMPARISON:  09/23/2011 x-ray.  CT angiogram 12/07/2021 FINDINGS: Neurostimulator device with leads along the lower thoracic spine. No consolidation, pneumothorax or effusion. No edema. Normal cardiopericardial silhouette. Tortuous and ectatic aorta. Eventration of the right hemidiaphragm. IMPRESSION: No acute cardiopulmonary disease.  Neurostimulator. Electronically Signed   By: Karen Kays M.D.   On: 06/12/2022 18:20    Pending Labs Unresulted Labs (From admission, onward)     Start     Ordered   06/19/22 0500  Creatinine, serum  (enoxaparin (LOVENOX)    CrCl >/= 30 ml/min)  Weekly,   R     Comments: while on enoxaparin therapy    06/12/22 2219   06/13/22 0500  Basic metabolic panel  Tomorrow morning,   R       Comments: As Scheduled for 5 days    06/12/22 2219   06/12/22 2217  HIV Antibody (routine testing w rflx)  (HIV Antibody (Routine testing w reflex) panel)  Once,   R        06/12/22 2219   06/12/22 2217  CBC  (enoxaparin (LOVENOX)    CrCl >/= 30 ml/min)  Once,   R       Comments: Baseline for enoxaparin therapy IF NOT ALREADY DRAWN.  Notify MD if  PLT < 100 K.    06/12/22 2219   06/12/22 2217  Creatinine, serum  (enoxaparin (LOVENOX)    CrCl >/= 30 ml/min)  Once,   R       Comments: Baseline for enoxaparin therapy IF NOT ALREADY DRAWN.    06/12/22 2219  Vitals/Pain Today's Vitals   06/12/22 1725 06/12/22 1726 06/12/22 2111 06/12/22 2113  BP:  (!) 133/92 (!) 133/103   Pulse:   86   Resp:   18   Temp:    98.9 F (37.2 C)  TempSrc:    Oral  SpO2:   100%   Weight: 98.9 kg     Height: 6\' 1"  (1.854 m)     PainSc: 0-No pain       Isolation Precautions No active isolations  Medications Medications  sodium chloride flush (NS) 0.9 % injection 3 mL (has no administration in time range)  sodium chloride flush (NS) 0.9 % injection 3 mL (has no administration in time range)  0.9 %  sodium chloride infusion (has no administration in time range)  acetaminophen (TYLENOL) tablet 650 mg (has no administration in time range)  ondansetron (ZOFRAN) injection 4 mg (has no administration in time range)  enoxaparin (LOVENOX) injection 40 mg (has no administration in time range)    Mobility non-ambulatory     Focused Assessments Cardiac Assessment Handoff:    Lab Results  Component Value Date   CKTOTAL 260 (H) 09/27/2011   CKMB 2.3 09/27/2011   TROPONINI <0.30 09/27/2011   Lab Results  Component Value Date   DDIMER 0.39 09/23/2011   Does the Patient currently have chest pain? No    R Recommendations: See Admitting Provider Note  Report given to:   Additional Notes:

## 2022-06-12 NOTE — ED Provider Notes (Signed)
[place] EMERGENCY DEPARTMENT AT [facility] Provider Note   CSN: [id number] Arrival date & time: 06/12/22  1658     History  Chief Complaint  Patient presents with   Chest Pain   Hemoptysis    Lance Mills is a 62 y.o. male.  62 yo M with a chief complaints of needing to see a cardiologist.  He tells me that he has been having some mild chest discomfort for at least a month.  He had an episode where he had syncopized at home and was seen by cardiology in the clinic.  He has had a heart monitor in place.  He had a couple times where his heart felt like it was racing and he pushed the button.  He received a call today that told him he needed to come to the ED here and not to leave until he was seen by cardiologist.  Burgess Estelle he had some very forceful coughing which she has been having off and on but he coughed up a bit of blood with it.   Chest Pain      Home Medications Prior to Admission medications   Medication Sig Start Date End Date Taking? Authorizing Provider  diltiazem (CARDIZEM CD) 180 MG 24 hr capsule Take 1 capsule (180 mg total) by mouth daily. 09/27/11 09/26/12  McTyre, Clearence Cheek, MD  ezetimibe (ZETIA) 10 MG tablet Take 1 tablet (10 mg total) by mouth daily. 03/09/22   Pricilla Riffle, MD  naproxen (NAPROSYN) 500 MG tablet Take 500 mg by mouth 2 (two) times daily with a meal.    [provider]  rosuvastatin (CRESTOR) 20 MG tablet Take 1 tablet (20 mg total) by mouth daily. 12/30/21   Pricilla Riffle, MD  traMADol (ULTRAM) 50 MG tablet Take 50 mg by mouth every 6 (six) hours as needed. For pain    [provider]      Allergies    Amoxicillin, Biaxin [clarithromycin], Egg-derived products, Flomax [tamsulosin], Midazolam, Nitroglycerin er, Other, Penicillins, Toradol [ketorolac tromethamine], and Morphine    Review of Systems   Review of Systems  Cardiovascular:  Positive for chest pain.    Physical Exam Updated Vital Signs BP (!)  133/92   Pulse 98   Temp 98.5 F (36.9 C) (Oral)   Resp 18   Ht 6\' 1"  (1.854 m)   Wt 98.9 kg   SpO2 98%   BMI 28.76 kg/m  Physical Exam Vitals and nursing note reviewed.  Constitutional:      Appearance: He is well-developed.  HENT:     Head: Normocephalic and atraumatic.  Eyes:     Pupils: Pupils are equal, round, and reactive to light.  Neck:     Vascular: No JVD.  Cardiovascular:     Rate and Rhythm: Normal rate and regular rhythm.     Heart sounds: No murmur heard.    No friction rub. No gallop.  Pulmonary:     Effort: No respiratory distress.     Breath sounds: No wheezing.  Abdominal:     General: There is no distension.     Tenderness: There is no abdominal tenderness. There is no guarding or rebound.  Musculoskeletal:        General: Normal range of motion.     Cervical back: Normal range of motion and neck supple.  Skin:    Coloration: Skin is not pale.     Findings: No rash.  Neurological:     Mental  Status: He is alert and oriented to person, place, and time.  Psychiatric:        Behavior: Behavior normal.     ED Results / Procedures / Treatments   Labs (all labs ordered are listed, but only abnormal results are displayed) Labs Reviewed  BASIC METABOLIC PANEL - Abnormal; Notable for the following components:      Result Value   CO2 21 (*)    Creatinine, Ser 1.39 (*)    Calcium 8.8 (*)    GFR, Estimated 58 (*)    All other components within normal limits  CBC  TROPONIN I (HIGH SENSITIVITY)  TROPONIN I (HIGH SENSITIVITY)    EKG EKG Interpretation  Date/Time:  Friday Jun 12 2022 17:20:37 EDT Ventricular Rate:  95 PR Interval:  270 QRS Duration: 90 QT Interval:  348 QTC Calculation: 437 R Axis:   258 Text Interpretation: Sinus rhythm with 1st degree A-V block Right superior axis deviation Possible Anterior infarct , age undetermined Abnormal ECG No significant change since last tracing Confirmed by Melene Plan [phone]) on 06/12/2022 7:45:45  PM  Radiology DG Chest 2 View  Result Date: 06/12/2022 CLINICAL DATA:  Chest pain EXAM: CHEST - 2 VIEW COMPARISON:  09/23/2011 x-ray.  CT angiogram 12/07/2021 FINDINGS: Neurostimulator device with leads along the lower thoracic spine. No consolidation, pneumothorax or effusion. No edema. Normal cardiopericardial silhouette. Tortuous and ectatic aorta. Eventration of the right hemidiaphragm. IMPRESSION: No acute cardiopulmonary disease.  Neurostimulator. Electronically Signed   By: Karen Kays M.D.   On: 06/12/2022 18:20    Procedures Procedures    Medications Ordered in ED Medications - No data to display  ED Course/ Medical Decision Making/ A&P                             Medical Decision Making Amount and/or Complexity of Data Reviewed Labs: ordered. Radiology: ordered.   62 yo M with a chief complaints of mild chest pain palpitations and a request to see a cardiologist today.  I suspect the patient had some sort of arrhythmia that was identified on his cardiac monitor and he was told to come here to be evaluated.  Unfortunately I cannot visualize the cardiac monitor.  I did discuss this with the cardiology fellow on-call.  Plan to evaluate the patient.  Patient has had some cough off and on.  Has had some hemoptysis reported in the past.  Has had a CT angiogram of the chest with this that was negative.  His initial troponin is negative.  No significant electrolyte abnormality.  EKG without obvious arrhythmia, no ischemic findings.  The patients results and plan were reviewed and discussed.   Any x-rays performed were independently reviewed by myself.   Differential diagnosis were considered with the presenting HPI.  Medications - No data to display  Vitals:   06/12/22 1711 06/12/22 1725 06/12/22 1726  BP: (!) 150/140  (!) 133/92  Pulse: 98    Resp: 18    Temp: 98.5 F (36.9 C)    TempSrc: Oral    SpO2: 98%    Weight:  98.9 kg   Height:  6\' 1"  (1.854 m)     Final  diagnoses:  Palpitations    Admission/ observation were discussed with the admitting physician, patient and/or family and they are comfortable with the plan.           Final Clinical Impression(s) / ED Diagnoses Final diagnoses:  Palpitations    Rx / DC Orders ED Discharge Orders     None         Melene Plan, DO 06/12/22 2039

## 2022-06-12 NOTE — ED Triage Notes (Signed)
Pt sent by cardiology at [facility] to see Dr Elberta Fortis (EP) and Dr Tenny Craw (cards). Pt has been wearing a monitor for a "month".  Pt c/o "light pressure and pain in my chest". Pt states he had hemoptysis yesterday. Pt has had lightheadedness and dizziness yesterday when coughing.

## 2022-06-13 ENCOUNTER — Other Ambulatory Visit: Payer: Self-pay

## 2022-06-13 ENCOUNTER — Inpatient Hospital Stay (HOSPITAL_COMMUNITY): Payer: No Typology Code available for payment source

## 2022-06-13 DIAGNOSIS — I459 Conduction disorder, unspecified: Secondary | ICD-10-CM

## 2022-06-13 DIAGNOSIS — R008 Other abnormalities of heart beat: Secondary | ICD-10-CM

## 2022-06-13 LAB — ECHOCARDIOGRAM COMPLETE
AR max vel: 5.05 cm2
AV Peak grad: 4.7 mmHg
Ao pk vel: 1.08 m/s
Area-P 1/2: 4.31 cm2
Calc EF: 50.2 %
Height: 72 in
S' Lateral: 2.5 cm
Single Plane A2C EF: 50.8 %
Single Plane A4C EF: 50.9 %
Weight: 3541.47 oz

## 2022-06-13 LAB — BASIC METABOLIC PANEL
Anion gap: 9 (ref 5–15)
BUN: 15 mg/dL (ref 8–23)
CO2: 23 mmol/L (ref 22–32)
Calcium: 8.9 mg/dL (ref 8.9–10.3)
Chloride: 106 mmol/L (ref 98–111)
Creatinine, Ser: 1.3 mg/dL — ABNORMAL HIGH (ref 0.61–1.24)
GFR, Estimated: >60 mL/min (ref >60)
Glucose, Bld: 94 mg/dL (ref 70–99)
Potassium: 3.5 mmol/L (ref 3.5–5.1)
Sodium: 138 mmol/L (ref 135–145)

## 2022-06-13 LAB — BRAIN NATRIURETIC PEPTIDE: B Natriuretic Peptide: 29.8 pg/mL (ref 0.0–100.0)

## 2022-06-13 LAB — CREATININE, SERUM
Creatinine, Ser: 1.32 mg/dL — ABNORMAL HIGH (ref 0.61–1.24)
GFR, Estimated: >60 mL/min (ref >60)

## 2022-06-13 LAB — HIV ANTIBODY (ROUTINE TESTING W REFLEX): HIV Screen 4th Generation wRfx: NONREACTIVE

## 2022-06-13 NOTE — Consult Note (Signed)
Electrophysiology Consultation   Patient ID: Lance Mills MRN: [id number]; DOB: 1960-08-10  Admit date: 06/12/2022 Date of Consult: 06/13/2022  PCP:  [facility] Providers Cardiologist:  None   {  History of Present Illness:   Mr. Lance Mills is a pleasant 62 year old man who I am seeing today for an evaluation of syncope and an abnormal heart monitor at the request of Dr. Adela Mills.  The patient typically receives his medical care at the [facility] but has previously seen Dr. Tenny Mills with heart care.  He has a remote history of syncope back in November 2023.  He was at home watching a football game when he began feeling poorly.  He felt like he could not take a breath.  He attempted to get up from the couch and then completely lost consciousness injuring his head.  He presented to the hospital where evaluation revealed no significant injuries other than a laceration to his forehead.  No arrhythmias were noted during that hospital stay.  He was referred to Dr. Tenny Mills at that time.  He tells me that the [facility] has previously done a cardiac workup for him including an echocardiogram and cardiac MRI although I do not have those records.  No other syncopal history.  He tells me that he snores.  He has been evaluated for sleep apnea several years ago but that was apparently negative.  His significant other has not noted any abnormal breathing patterns at night.    Past medical, surgical, family and social histories reviewed.  ROS:  Please see the history of present illness.  All other ROS reviewed and negative.     Physical Exam/Data:   Vitals:   06/12/22 2315 06/13/22 0000 06/13/22 0514 06/13/22 0759  BP: 133/89 (!) 137/95 139/89 129/87  Pulse: 83 86 86 71  Resp: 18 17 17 16   Temp: 98 F (36.7 C) 97.9 F (36.6 C) 97.9 F (36.6 C) 97.8 F (36.6 C)  TempSrc: Oral Oral Oral Oral  SpO2: 99% 99% 98% 98%  Weight:  100.4 kg    Height:  6' (1.829 m)     No  intake or output data in the 24 hours ending 06/13/22 0816    06/13/2022   12:00 AM 06/12/2022    5:25 PM 12/24/2021    1:59 PM  Last 3 Weights  Weight (lbs) 221 lb 5.5 oz 218 lb 220 lb 6.4 oz  Weight (kg) 100.4 kg 98.884 kg 99.973 kg     Body mass index is 30.02 kg/m.   General:  Well nourished, well developed, in no acute distress Cardiac:  normal S1, S2; RRR; no murmur  Lungs:  clear to auscultation bilaterally, no wheezing, rhonchi or rales  Psych:  Normal affect   EKG:  The EKG was personally reviewed and demonstrates: First-degree AV delay, sinus rhythm, narrow QRS Telemetry:  Telemetry was personally reviewed and demonstrates: Sinus rhythm with first-degree AV delay  Relevant CV Studies:  2013 SPECT showed no ischemia or prior infarction  Echo pending    Assessment and Plan:   Mr. Lance Mills is a 62 year old man who presented to the hospital with an abnormal heart monitor and prior history of syncope.  ZIO monitor showed several episodes of complete heart block with prolonged ventricular standstill, longest 9.7 seconds.  The monitor also showed symptomatic Wenckebach.  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  Unclear cause at this time.  He is previously been evaluated for sleep apnea and this was negative.  I do not have results of a remote cardiac MRI.  Given the interval between now and the prior cardiac MRI, I do think this should be repeated to make sure there is no evidence of infiltrative disease. Plan for cardiac MRI on Monday if schedule permits Patient will require permanent pacing.  Keep n.p.o. Sunday night in case this is possible Monday. Avoid AV nodal blockers Echo pending    For questions or updates, please contact [facility] Please consult [email or web address] for contact info under    Signed, Lance Prude, MD  06/13/2022 8:16 AM

## 2022-06-13 NOTE — Progress Notes (Signed)
Echocardiogram 2D Echocardiogram has been performed.  Lance Mills 06/13/2022, 1:29 PM

## 2022-06-14 DIAGNOSIS — I459 Conduction disorder, unspecified: Secondary | ICD-10-CM | POA: Diagnosis not present

## 2022-06-14 NOTE — Progress Notes (Signed)
   Rounding Note    Patient Name: NOVA TIENKEN Date of Encounter: 06/14/2022  [facility] Cardiologist: None   Subjective   NAEO. Echo done yesterday.  Vital Signs    Vitals:   06/13/22 2100 06/14/22 0000 06/14/22 0350 06/14/22 0811  BP: (!) 141/99 130/87 129/89 (!) 128/102  Pulse: 78 80 70 84  Resp: 17 16 18 18   Temp: 98 F (36.7 C) 97.8 F (36.6 C) 97.9 F (36.6 C) 97.9 F (36.6 C)  TempSrc: Oral Oral Oral Oral  SpO2: 99% 97% 98% 98%  Weight:      Height:        Intake/Output Summary (Last 24 hours) at 06/14/2022 0905 Last data filed at 06/14/2022 0800 Gross per 24 hour  Intake 1170 ml  Output --  Net 1170 ml      06/13/2022   12:07 AM 06/13/2022   12:01 AM 06/13/2022   12:00 AM  Last 3 Weights  Weight (lbs) 221 lb 5.5 oz 221 lb 5.5 oz 221 lb 5.5 oz  Weight (kg) 100.4 kg 100.4 kg 100.4 kg      Telemetry    Sinus, CHB at 7:43am today w narrow escape - Personally Reviewed  ECG    Personally Reviewed  Physical Exam   GEN: No acute distress.   Cardiac: RRR, no murmurs, rubs, or gallops.  Respiratory: Clear to auscultation bilaterally. 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  Unclear cause at this time.  He is previously been evaluated for sleep apnea and this was negative.  I do not have results of a remote cardiac MRI.  Given the interval between now and the prior cardiac MRI, I do think this should be repeated to make sure there is no evidence of infiltrative disease. Plan for cardiac MRI on Monday if schedule permits Patient will require permanent pacing.  Keep n.p.o. Sunday night in case this is  possible Monday. Avoid AV nodal blockers   Sheria Lang T. Lalla Brothers, MD, [facility], [facility] Cardiac Electrophysiology

## 2022-06-15 ENCOUNTER — Inpatient Hospital Stay (HOSPITAL_COMMUNITY): Payer: No Typology Code available for payment source

## 2022-06-15 ENCOUNTER — Encounter: Payer: Self-pay | Admitting: Student

## 2022-06-15 DIAGNOSIS — I459 Conduction disorder, unspecified: Secondary | ICD-10-CM

## 2022-06-15 LAB — SURGICAL PCR SCREEN
MRSA, PCR: NEGATIVE
Staphylococcus aureus: NEGATIVE

## 2022-06-15 MED ORDER — METOPROLOL TARTRATE 5 MG/5ML IV SOLN
INTRAVENOUS | Status: AC
  Filled 2022-06-15: qty 5

## 2022-06-15 MED ORDER — SODIUM CHLORIDE 0.9 % IV SOLN: 80.0000 mg | INTRAVENOUS | Status: DC

## 2022-06-15 MED ORDER — SODIUM CHLORIDE 0.9 % IV SOLN: INTRAVENOUS | Status: DC

## 2022-06-15 MED ORDER — VANCOMYCIN HCL 1500 MG/300ML IV SOLN: 1500.0000 mg | INTRAVENOUS | Status: DC

## 2022-06-15 MED ORDER — GADOBUTROL 1 MMOL/ML IV SOLN
10.0000 mL | Freq: Once | INTRAVENOUS | Status: AC | PRN
  Administered 2022-06-15: 10 mL via INTRAVENOUS

## 2022-06-15 NOTE — Plan of Care (Signed)
Patient remains on HF PCU. Plan for permanent pacemaker implantation 06/16/22.  Edit 0045 hrs: Ventricular pause x 6 beats (~6.4 sec) on telemetry - P - waves were present but no QRS or T. Consistent with prior telemetry alarms. Patient reports mild chest pressure but no pain or SHOB; spontaneously resolved. 12-lead obtained and placed on paper chart.   Problem: Education: Goal: Knowledge of General Education information will improve Description: Including pain rating scale, medication(s)/side effects and non-pharmacologic comfort measures Outcome: Progressing   Problem: Health Behavior/Discharge Planning: Goal: Ability to manage health-related needs will improve Outcome: Progressing   Problem: Clinical Measurements: Goal: Ability to maintain clinical measurements within normal limits will improve Outcome: Progressing Goal: Will remain free from infection Outcome: Progressing Goal: Diagnostic test results will improve Outcome: Progressing Goal: Respiratory complications will improve Outcome: Progressing Goal: Cardiovascular complication will be avoided Outcome: Progressing   Problem: Activity: Goal: Risk for activity intolerance will decrease Outcome: Progressing   Problem: Nutrition: Goal: Adequate nutrition will be maintained Outcome: Progressing   Problem: Coping: Goal: Level of anxiety will decrease Outcome: Progressing   Problem: Elimination: Goal: Will not experience complications related to bowel motility Outcome: Progressing Goal: Will not experience complications related to urinary retention Outcome: Progressing   Problem: Pain Managment: Goal: General experience of comfort will improve Outcome: Progressing   Problem: Safety: Goal: Ability to remain free from injury will improve Outcome: Progressing   Problem: Skin Integrity: Goal: Risk for impaired skin integrity will decrease Outcome: Progressing   Problem: Education: Goal: Knowledge of cardiac device  and self-care will improve Outcome: Progressing Goal: Ability to safely manage health related needs after discharge will improve Outcome: Progressing Goal: Individualized Educational Video(s) Outcome: Progressing   Problem: Cardiac: Goal: Ability to achieve and maintain adequate cardiopulmonary perfusion will improve Outcome: Progressing

## 2022-06-15 NOTE — Progress Notes (Addendum)
  Patient Name: Lance Mills Date of Encounter: 06/15/2022  Primary Cardiologist: None Electrophysiologist: New  Interval Summary   The patient is doing well today.  At this time, the patient denies chest pain, shortness of breath, or any new concerns.  He was unaware of pauses overnight.   Inpatient Medications    Scheduled Meds:  acidophilus  1 capsule Oral Daily   atorvastatin  40 mg Oral Daily   gentamicin (GARAMYCIN) 80 mg in sodium chloride 0.9 % 500 mL irrigation  80 mg Irrigation To SSTC   sodium chloride flush  3 mL Intravenous Q12H   Continuous Infusions:  sodium chloride     sodium chloride     vancomycin     PRN Meds: sodium chloride, acetaminophen, diphenoxylate-atropine, ondansetron (ZOFRAN) IV, sodium chloride flush   Vital Signs    Vitals:   06/14/22 1525 06/14/22 1947 06/14/22 2336 06/15/22 0404  BP: (!) 150/96 (!) 154/97 124/88 (!) 135/93  Pulse: 82 88 86 79  Resp: 20 20 18 18   Temp: 98.2 F (36.8 C) 97.9 F (36.6 C) 98 F (36.7 C) 97.9 F (36.6 C)  TempSrc: Oral Axillary Axillary Oral  SpO2: 100% 100% 99% 98%  Weight:    97.6 kg  Height:        Intake/Output Summary (Last 24 hours) at 06/15/2022 0840 Last data filed at 06/14/2022 2337 Gross per 24 hour  Intake 1080 ml  Output --  Net 1080 ml   Filed Weights   06/13/22 0007 06/14/22 1236 06/15/22 0404  Weight: 100.4 kg 98.3 kg 97.6 kg    Physical Exam    GEN- The patient is well appearing, alert and oriented x 3 today.   Lungs- Clear to ausculation bilaterally, normal work of breathing Cardiac- Regular rate and rhythm, no murmurs, rubs or gallops GI- soft, NT, ND, + BS Extremities- no clubbing or cyanosis. No edema  Telemetry    NSR 80s this am, with intermittent CHB with ventricular standstill overnight (personally reviewed)  Hospital Course    Lance Mills is a 62 year old man who presented to the hospital with an abnormal heart monitor and prior history of syncope. ZIO monitor  showed several episodes of complete heart block with prolonged ventricular standstill, longest 9.7 seconds. The monitor also showed symptomatic Wenckebach.   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  Negative sleep study in the past.  Remote cMRI -> Plan to repeat.  Will discuss timing with staff  Explained risks, benefits, and alternatives to PPM implantation, including but not limited to bleeding, infection, pneumothorax, pericardial effusion, lead dislodgement, heart attack, stroke, or death.  Pt verbalized understanding and agrees to proceed.  Will likely need to proceed with cMRI prior to pacing, in the event infiltrating cardiomyopathy is noted and need to consider ICD.    He is NPO in case both MRI and PPM can be considered today.   For questions or updates, please contact [facility] Please consult [email or web address] for contact info under Cardiology/STEMI.  Signed, Graciella Freer, PA-C  06/15/2022, 8:40 AM

## 2022-06-15 NOTE — TOC Initial Note (Signed)
Transition of Care [facility]) - Initial/Assessment Note    Patient Details  Name: Lance Mills MRN: [id number] Date of Birth: 07/06/1960  Transition of Care [facility]) CM/SW Contact:    Leone Haven, RN Phone Number: 06/15/2022, 6:12 PM  Clinical Narrative:                 From home alone, indep, PCP is Archie Balboa at the [facility]. He states he also has medciare A and B.  He has walkers and a cane at home, but doe not use them regularly.  He states he has a neuro stimulator which helps him to walk.  He does not have any HH services in place at this time.  He states a friend will transport  him at dc. And he still drives himself also.  His support person is [employer].  He gets his medications from [place] or [place], if he goes to a local pharmacy it will be [facility] [address].  He states the doctor told him they will proceed with the pacemaker tomorrow in the am.   CSW at [facility] clinic is Caitlin [phone] ext [phone].        Patient Goals and CMS Choice            Expected Discharge Plan and Services                                              Prior Living Arrangements/Services                       Activities of Daily Living Home Assistive Devices/Equipment: None ADL Screening (condition at time of admission) Patient's cognitive ability adequate to safely complete daily activities?: Yes Is the patient deaf or have difficulty hearing?: No Does the patient have difficulty seeing, even when wearing glasses/contacts?: No Does the patient have difficulty concentrating, remembering, or making decisions?: No Patient able to express need for assistance with ADLs?: Yes Does the patient have difficulty dressing or bathing?: No Independently performs ADLs?: Yes (appropriate for developmental age) Does the patient have difficulty walking or climbing stairs?: No Weakness of Legs: None Weakness of  Arms/Hands: None  Permission Sought/Granted                  Emotional Assessment              Admission diagnosis:  Palpitations [R00.2] Heart block [I45.9] Patient Active Problem List   Diagnosis Date Noted   Heart block 06/12/2022   Family history of coronary artery disease 09/26/2011   Chest pain, elevated CK, negative Troponin 09/23/2011   Hypertension 09/23/2011   PCP:  Clinic, [facility] Pharmacy:   [facility] - [place], [place] - [address] [place] [address] Phone: [phone] Fax: [phone]     Social Determinants of Health (SDOH) Social History: SDOH Screenings   Food Insecurity: Patient Declined (06/12/2022)  Housing: Patient Declined (06/12/2022)  Transportation Needs: No Transportation Needs (06/12/2022)  Utilities: Not At Risk (06/12/2022)  Tobacco Use: Unknown (06/12/2022)   SDOH Interventions:     Readmission Risk Interventions     No data to display

## 2022-06-16 ENCOUNTER — Other Ambulatory Visit: Payer: Self-pay

## 2022-06-16 ENCOUNTER — Encounter (HOSPITAL_COMMUNITY)
Admission: EM | Disposition: A | Discharge status: Home / Self Care | Payer: Self-pay | Source: Ambulatory Visit | Attending: Internal Medicine

## 2022-06-16 DIAGNOSIS — I459 Conduction disorder, unspecified: Secondary | ICD-10-CM | POA: Diagnosis not present

## 2022-06-16 HISTORY — PX: PACEMAKER IMPLANT: EP1218

## 2022-06-16 SURGERY — PACEMAKER IMPLANT

## 2022-06-16 MED ORDER — SODIUM CHLORIDE 0.9 % IV SOLN: INTRAVENOUS | Status: DC

## 2022-06-16 MED ORDER — SODIUM CHLORIDE 0.9 % IV SOLN
80.0000 mg | INTRAVENOUS | Status: AC
  Administered 2022-06-16: 80 mg
  Filled 2022-06-16: qty 2

## 2022-06-16 MED ORDER — LIDOCAINE HCL (PF) 1 % IJ SOLN
INTRAMUSCULAR | Status: AC
  Filled 2022-06-16: qty 60

## 2022-06-16 MED ORDER — VANCOMYCIN HCL IN DEXTROSE 1-5 GM/200ML-% IV SOLN
1000.0000 mg | Freq: Two times a day (BID) | INTRAVENOUS | Status: AC
  Administered 2022-06-17: 1000 mg via INTRAVENOUS
  Filled 2022-06-16: qty 200

## 2022-06-16 MED ORDER — FENTANYL CITRATE (PF) 100 MCG/2ML IJ SOLN
INTRAMUSCULAR | Status: AC
  Filled 2022-06-16: qty 2

## 2022-06-16 MED ORDER — VANCOMYCIN HCL IN DEXTROSE 1-5 GM/200ML-% IV SOLN
1000.0000 mg | INTRAVENOUS | Status: AC
  Administered 2022-06-16: 1000 mg via INTRAVENOUS
  Filled 2022-06-16: qty 200

## 2022-06-16 MED ORDER — ORAL CARE MOUTH RINSE: 15.0000 mL | OROMUCOSAL | Status: DC | PRN

## 2022-06-16 MED ORDER — CHLORHEXIDINE GLUCONATE 4 % EX SOLN
60.0000 mL | Freq: Once | CUTANEOUS | Status: AC
  Administered 2022-06-16: 4 via TOPICAL

## 2022-06-16 MED ORDER — HEPARIN (PORCINE) IN NACL 1000-0.9 UT/500ML-% IV SOLN
INTRAVENOUS | Status: DC | PRN
  Administered 2022-06-16: 500 mL

## 2022-06-16 MED ORDER — FENTANYL CITRATE (PF) 100 MCG/2ML IJ SOLN
INTRAMUSCULAR | Status: DC | PRN
  Administered 2022-06-16 (×2): 50 ug via INTRAVENOUS

## 2022-06-16 MED ORDER — VANCOMYCIN HCL IN DEXTROSE 1-5 GM/200ML-% IV SOLN
INTRAVENOUS | Status: AC
  Filled 2022-06-16: qty 200

## 2022-06-16 MED ORDER — LIDOCAINE HCL (PF) 1 % IJ SOLN
INTRAMUSCULAR | Status: DC | PRN
  Administered 2022-06-16: 50 mL

## 2022-06-16 MED ORDER — HYDRALAZINE HCL 25 MG PO TABS
25.0000 mg | ORAL_TABLET | Freq: Three times a day (TID) | ORAL | Status: DC
  Administered 2022-06-16 – 2022-06-17 (×3): 25 mg via ORAL
  Filled 2022-06-16 (×3): qty 1

## 2022-06-16 MED ORDER — HYDRALAZINE HCL 20 MG/ML IJ SOLN: 10.0000 mg | Freq: Once | INTRAMUSCULAR | Status: DC

## 2022-06-16 MED ORDER — CHLORHEXIDINE GLUCONATE 4 % EX SOLN
60.0000 mL | Freq: Once | CUTANEOUS | Status: AC
  Administered 2022-06-16: 4 via TOPICAL
  Filled 2022-06-16: qty 15

## 2022-06-16 MED ORDER — SODIUM CHLORIDE 0.9 % IV SOLN
INTRAVENOUS | Status: AC
  Filled 2022-06-16: qty 2

## 2022-06-16 SURGICAL SUPPLY — 14 items
CABLE SURGICAL S-101-97-12 (CABLE) ×1 IMPLANT
CATH RIGHTSITE C315HIS02 (CATHETERS) IMPLANT
IPG PACE AZUR XT DR MRI W1DR01 (Pacemaker) IMPLANT
LEAD CAPSURE NOVUS 5076-52CM (Lead) IMPLANT
LEAD SELECT SECURE 3830 383069 (Lead) IMPLANT
MAT PREVALON FULL STRYKER (MISCELLANEOUS) IMPLANT
PACE AZURE XT DR MRI W1DR01 (Pacemaker) ×1 IMPLANT
PAD DEFIB RADIO PHYSIO CONN (PAD) ×1 IMPLANT
SELECT SECURE 3830 383069 (Lead) ×1 IMPLANT
SHEATH 7FR PRELUDE SNAP 13 (SHEATH) IMPLANT
SHEATH PROBE COVER 6X72 (BAG) IMPLANT
SLITTER 6232ADJ (MISCELLANEOUS) IMPLANT
TRAY PACEMAKER INSERTION (PACKS) ×1 IMPLANT
WIRE HI TORQ VERSACORE-J 145CM (WIRE) IMPLANT

## 2022-06-16 NOTE — Progress Notes (Addendum)
Patient Name: Lance Mills Date of Encounter: 06/16/2022  Primary Cardiologist: None Electrophysiologist: New  Interval Summary   The patient is doing well today.  At this time, the patient denies chest pain, shortness of breath, or any new concerns.  Inpatient Medications    Scheduled Meds:  acidophilus  1 capsule Oral Daily   atorvastatin  40 mg Oral Daily   chlorhexidine  60 mL Topical Once   gentamicin (GARAMYCIN) 80 mg in sodium chloride 0.9 % 500 mL irrigation  80 mg Irrigation On Call   sodium chloride flush  3 mL Intravenous Q12H   Continuous Infusions:  sodium chloride     sodium chloride 50 mL/hr at 06/16/22 0700   sodium chloride 50 mL/hr at 06/16/22 0700   vancomycin     PRN Meds: sodium chloride, acetaminophen, diphenoxylate-atropine, ondansetron (ZOFRAN) IV, mouth rinse, sodium chloride flush   Vital Signs    Vitals:   06/15/22 2110 06/16/22 0024 06/16/22 0405 06/16/22 0820  BP: (!) 139/106 (!) 135/106 (!) 148/94 (!) 131/90  Pulse: 73 79 77 80  Resp: 18 18 18 18   Temp: 97.6 F (36.4 C) 98 F (36.7 C) 98 F (36.7 C) 98 F (36.7 C)  TempSrc: Oral Oral Oral Oral  SpO2: 100% 98% 97% 98%  Weight:   96.8 kg   Height:        Intake/Output Summary (Last 24 hours) at 06/16/2022 0829 Last data filed at 06/16/2022 0700 Gross per 24 hour  Intake 182.16 ml  Output --  Net 182.16 ml   Filed Weights   06/14/22 1236 06/15/22 0404 06/16/22 0405  Weight: 98.3 kg 97.6 kg 96.8 kg    Physical Exam    GEN- The patient is well appearing, alert and oriented x 3 today.   Lungs- Clear to ausculation bilaterally, normal work of breathing Cardiac-  Regular  rate and rhythm, no murmurs, rubs or gallops GI- soft, NT, ND, + BS Extremities- no clubbing or cyanosis. No edema  Telemetry    NSR 70-80s, had intermittent CHB (personally reviewed)  Hospital Course    Mr. Gieske is a 61 year old man who presented to the hospital with an abnormal heart monitor and prior  history of syncope. ZIO monitor showed several episodes of complete heart block with prolonged ventricular standstill, longest 9.7 seconds. The monitor also showed symptomatic Wenckebach.   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  Negative sleep study in the past.  cMRI 6/3 with small area of subepicardial LGE in mid inferior wall, non-ischemic pattern, more consistent with prior Myocarditis.  No sarcoid.   Explained risks, benefits, and alternatives to PPM implantation, including but not limited to bleeding, infection, pneumothorax, pericardial effusion, lead dislodgement, heart attack, stroke, or death.  Pt verbalized understanding and agrees to proceed.  For questions or updates, please contact [facility] Please consult [email or web address] for contact info under Cardiology/STEMI.  Signed, Lance Freer, PA-C  06/16/2022, 8:29 AM   I have seen and examined this patient with Lance Mills.  Agree with above, note added to reflect my findings.  Patient feeling well today.  Ready for pacemaker implant.  GEN: Well nourished, well developed, in no acute distress  HEENT: normal  Neck: no JVD, carotid bruits, or masses Cardiac: RRR; no murmurs, rubs, or gallops,no edema  Respiratory:  clear to auscultation bilaterally, normal work of  breathing GI: soft, nontender, nondistended, + BS MS: no deformity or atrophy  Skin: warm and dry Neuro:  Strength and sensation are intact Psych: euthymic mood, full affect   Intermittent complete heart block with ventricular standstill: Pacemaker implant planned for today.  Risk and benefits of been discussed.  Risk as above.  Patient understands the risks and is agreed to the procedure.  Lance Borrayo M. Adal Sereno MD 06/16/2022 1:14 PM

## 2022-06-17 ENCOUNTER — Inpatient Hospital Stay (HOSPITAL_COMMUNITY): Payer: No Typology Code available for payment source

## 2022-06-17 ENCOUNTER — Encounter (HOSPITAL_COMMUNITY): Payer: Self-pay | Admitting: Cardiology

## 2022-06-17 ENCOUNTER — Other Ambulatory Visit (HOSPITAL_COMMUNITY): Payer: Self-pay

## 2022-06-17 MED ORDER — IRBESARTAN 300 MG PO TABS: 300.0000 mg | ORAL_TABLET | Freq: Every day | ORAL | Status: DC

## 2022-06-17 MED ORDER — AMLODIPINE BESYLATE 10 MG PO TABS
10.0000 mg | ORAL_TABLET | Freq: Every day | ORAL | Status: DC
  Administered 2022-06-17: 10 mg via ORAL
  Filled 2022-06-17: qty 1

## 2022-06-17 MED ORDER — AMLODIPINE BESYLATE 10 MG PO TABS
10.0000 mg | ORAL_TABLET | Freq: Every day | ORAL | 6 refills | Status: AC
  Filled 2022-06-17: qty 30, 30d supply, fill #0

## 2022-06-17 MED ORDER — HYDRALAZINE HCL 25 MG PO TABS
25.0000 mg | ORAL_TABLET | Freq: Three times a day (TID) | ORAL | 6 refills | Status: DC
  Filled 2022-06-17: qty 90, 30d supply, fill #0

## 2022-06-17 MED ORDER — HYDRALAZINE HCL 25 MG PO TABS
25.0000 mg | ORAL_TABLET | Freq: Three times a day (TID) | ORAL | 6 refills | Status: DC
  Filled 2022-06-17: qty 60, 20d supply, fill #0

## 2022-06-17 MED ORDER — LOSARTAN POTASSIUM 50 MG PO TABS
100.0000 mg | ORAL_TABLET | Freq: Every day | ORAL | Status: DC
  Administered 2022-06-17: 100 mg via ORAL
  Filled 2022-06-17: qty 2

## 2022-06-17 MED ORDER — ACETAMINOPHEN 325 MG PO TABS: 650.0000 mg | ORAL_TABLET | ORAL | Status: AC | PRN

## 2022-06-17 NOTE — TOC Transition Note (Addendum)
Transition of Care [facility]) - CM/SW Discharge Note   Patient Details  Name: Lance Mills MRN: [id number] Date of Birth: [birth year]  Transition of [facility]) CM/SW Contact:  Leone Haven, RN Phone Number: 06/17/2022, 9:47 AM   Clinical Narrative:    Patient is for dc today, he states his friend , [name] will transport him home, he states he has no HH needs at this time.  NCM faxed information to [facility] PCP at [phone].          Patient Goals and CMS Choice      Discharge Placement                         Discharge Plan and Services Additional resources added to the After Visit Summary for                                       Social Determinants of Health (SDOH) Interventions SDOH Screenings   Food Insecurity: Patient Declined (06/12/2022)  Housing: Patient Declined (06/12/2022)  Transportation Needs: No Transportation Needs (06/12/2022)  Utilities: Not At Risk (06/12/2022)  Tobacco Use: Unknown (06/17/2022)     Readmission Risk Interventions     No data to display

## 2022-06-17 NOTE — Discharge Summary (Addendum)
ELECTROPHYSIOLOGY PROCEDURE DISCHARGE SUMMARY    Patient ID: Lance Mills,  MRN: [id number], DOB/AGE: [birth year] 62 y.o.  Admit date: 06/12/2022 Discharge date: 06/17/2022  Primary Care Physician: [facility], [facility]  Primary Cardiologist: None  Electrophysiologist: Dr. Elberta Fortis   Primary Discharge Diagnosis:  Symptomatic bradycardia status post pacemaker implantation this admission Intermittent complete heart block H/o syncope  Secondary Discharge Diagnosis:  HTN  Allergies  Allergen Reactions   Amoxicillin Anaphylaxis   Aspirin Anaphylaxis   Atenolol Anaphylaxis   Bee Venom Anaphylaxis   Biaxin [Clarithromycin] Anaphylaxis and Rash   Coconut (Cocos Nucifera) Anaphylaxis   Flomax [Tamsulosin] Anaphylaxis and Shortness Of Breath   Nitroglycerin Er Anaphylaxis, Palpitations and Rash   Penicillins Anaphylaxis and Rash   Prazosin Anaphylaxis   Procaine Anaphylaxis   Vioxx [Rofecoxib] Anaphylaxis   Gabapentin Other (See Comments)    Confusion and facial numbness   Midazolam Other (See Comments)   Other     Flu vaccine   Pregabalin Other (See Comments)   Toradol [Ketorolac Tromethamine]    Egg-Derived Products Swelling and Rash    Flu vaccine   Lortab [Hydrocodone-Acetaminophen] Rash   Morphine Nausea And Vomiting, Other (See Comments) and Rash    Reaction: Sweating (intolerance)   Tylenol With Codeine #3 [Acetaminophen-Codeine] Rash    Pt states he is Able to take regular tylenol, only allergic to tylenol with codeine     Procedures This Admission:  Echocardiogram 06/13/2022 LVEF 70-75%, mild ascending aortia dilation at 42mm  2. cMRI 06/15/2022 1. Small area of subepicardial LGE in mid inferior wall. This is a nonischemic scar pattern, could be due to prior myocarditis. Low suspicion for sarcoidosis, which tends to cause  multifocal LGE 2. Moderate LV hypertrophy measuring up to 15mm in basal septum (14mm in posterior wall). While wall thickness of 15mm  raises concern for HCM, 20mm is often used as the cutoff for HCM in African American patients, as can see wall thickness up to 20mm due to hypertension. Given this, and considering symmetric hypertrophy, suspect hypertensive heart disease 3.  Normal LV size and systolic function (EF 53%). 4.  Normal RV size and systolic function (EF 50%) 5.  Dilated ascending aorta measuring 44mm  3.  Implantation of a Medtronic Dual Chamber PPM on 06/16/2022 by Dr. Elberta Fortis. The patient received a Medtronic Azure XT DR [id number]  with a Medtronic CapSureFix Novus 5076 right atrial lead and a Medtronic SelectSure 3830 right ventricular lead.  There were no immediate post procedure complications.   4.  CXR on 06/17/2022 demonstrated no pneumothorax status post device implantation.       Brief HPI: Lance Mills is a 62 y.o. male was admitted for syncope and pre-syncope with pauses on monitor and electrophysiology team asked to see for consideration of PPM implantation.  Past medical history includes above.  The patient has had AV block without reversible causes identified.  Risks, benefits, and alternatives to PPM implantation were reviewed with the patient who wished to proceed.   Hospital Course:  The patient was admitted and underwent implantation of a Medtronic dual chamber PPM with details as outlined above.  He was monitored on telemetry overnight which demonstrated appropriate pacing.  Left chest was without hematoma or ecchymosis.  The device was interrogated and found to be functioning normally.  CXR was obtained and demonstrated no pneumothorax status post device implantation.  Wound care, arm mobility, and restrictions were reviewed with the patient.  The patient was examined and  considered stable for discharge to home.    Anticoagulation resumption This patient is not on anticoagulation     Physical Exam: Vitals:   06/16/22 1900 06/17/22 0108 06/17/22 0553 06/17/22 0837  BP: (!) 163/125 (!) 134/102 (!)  135/91 (!) 126/98  Pulse: 82 88 78 86  Resp: 18 18 18 20   Temp: (!) 97.5 F (36.4 C) 98.1 F (36.7 C) 98.1 F (36.7 C) 97.9 F (36.6 C)  TempSrc: Oral Oral Oral Oral  SpO2: 99% 99% 98% 100%  Weight:   97 kg   Height:        GEN- NAD. A&O x 3.  HEENT: Normocephalic, atraumatic Lungs- CTAB, Normal effort.  Heart- RRR, No M/G/R.  GI- Soft, NT, ND.  Extremities- No clubbing, cyanosis, or edema;  Skin- warm and dry, no rash or lesion, left chest without hematoma/ecchymosis  Discharge Medications:  Allergies as of 06/17/2022       Reactions   Amoxicillin Anaphylaxis   Aspirin Anaphylaxis   Atenolol Anaphylaxis   Bee Venom Anaphylaxis   Biaxin [clarithromycin] Anaphylaxis, Rash   Coconut (cocos Nucifera) Anaphylaxis   Flomax [tamsulosin] Anaphylaxis, Shortness Of Breath   Nitroglycerin Er Anaphylaxis, Palpitations, Rash   Penicillins Anaphylaxis, Rash   Prazosin Anaphylaxis   Procaine Anaphylaxis   Vioxx [rofecoxib] Anaphylaxis   Gabapentin Other (See Comments)   Confusion and facial numbness   Midazolam Other (See Comments)   Other    Flu vaccine   Pregabalin Other (See Comments)   Toradol [ketorolac Tromethamine]    Egg-derived Products Swelling, Rash   Flu vaccine   Lortab [hydrocodone-acetaminophen] Rash   Morphine Nausea And Vomiting, Other (See Comments), Rash   Reaction: Sweating (intolerance)   Tylenol With Codeine #3 [acetaminophen-codeine] Rash   Pt states he is Able to take regular tylenol, only allergic to tylenol with codeine        Medication List     TAKE these medications    acetaminophen 325 MG tablet Commonly known as: TYLENOL Take 2 tablets (650 mg total) by mouth every 4 (four) hours as needed for headache or mild pain.   ACIDOPHILUS LACTOBACILLUS PO Take 1 tablet by mouth daily.   amLODipine 10 MG tablet Commonly known as: NORVASC Take 1 tablet (10 mg total) by mouth daily. Start taking on: June 18, 2022 What changed:  medication  strength how much to take   atorvastatin 40 MG tablet Commonly known as: LIPITOR Take 40 mg by mouth at bedtime.   colestipol 1 g tablet Commonly known as: COLESTID Take 1 g by mouth as needed. Pt states he does not take medication daily as prescribed  states he only takes them PRN in order to avoid having constipation   diphenoxylate-atropine 2.5-0.025 MG tablet Commonly known as: LOMOTIL Take 1 tablet by mouth 4 (four) times daily as needed for diarrhea or loose stools. PRN   EpiPen 2-Pak 0.3 mg/0.3 mL Soaj injection Generic drug: EPINEPHrine Inject 0.3 mg into the muscle as needed for anaphylaxis. PRN   eucerin cream Apply 1 Application topically as needed for dry skin. Apply a thin layer to affected area daily as needed for dry skin.   furosemide 20 MG tablet Commonly known as: LASIX Take 10 mg by mouth daily.   hydrALAZINE 25 MG tablet Commonly known as: APRESOLINE Take 1 tablet (25 mg total) by mouth every 8 (eight) hours.   losartan 100 MG tablet Commonly known as: COZAAR Take 100 mg by mouth daily.   sildenafil 100  MG tablet Commonly known as: VIAGRA Take 100 mg by mouth daily as needed for erectile dysfunction. Take 1 tablet by mouth as instructed *Take 1 hour prior to sexual activity, do not exceed 1 dose per 24 hours*   VITAMIN D-3 PO Take 2,000 Units by mouth daily.        Disposition:    Follow-up Information     [facility] Follow up.   Specialty: Cardiology Why: on 6/19 at 320 pm for post pacemaker check Contact information: [address] [id number] mc [place] [address] [phone]                Duration of Discharge Encounter: Greater than 30 minutes including physician time.  Signed, Graciella Freer, PA-C  06/17/2022 9:10 AM  I have seen and examined this patient with Otilio Saber.  Agree with above, note added to reflect my findings.  On exam, RRR, no murmurs,  lungs clear.  She is now status post pacemaker implant for intermittent heart block.  Device functioning appropriately.  Chest x-ray and interrogation without issue.  Plan for discharge today with follow-up in device clinic.  Prisilla Kocsis M. Crystol Walpole MD 06/17/2022 1:02 PM

## 2022-06-17 NOTE — Discharge Instructions (Signed)
After Your Pacemaker   You have a Medtronic Pacemaker  ACTIVITY Do not lift your arm above shoulder height for 1 week after your procedure. After 7 days, you may progress as below.  You should remove your sling 24 hours after your procedure, unless otherwise instructed by your provider.     Wednesday June 24, 2022  Thursday June 25, 2022 Friday June 26, 2022 Saturday June 27, 2022   Do not lift, push, pull, or carry anything over 10 pounds with the affected arm until 6 weeks (Wednesday July 29, 2022 ) after your procedure.   You may drive AFTER your wound check, unless you have been told otherwise by your provider.   Ask your healthcare provider when you can go back to work   INCISION/Dressing If you are on a blood thinner such as Coumadin, Xarelto, Eliquis, Plavix, or Pradaxa please confirm with your provider when this should be resumed.   If large square, outer bandage is left in place, this can be removed after 24 hours from your procedure. Do not remove steri-strips or glue as below.   If a PRESSURE DRESSING (a bulky dressing that usually goes up over your shoulder) was applied or left in place, please follow instructions given by your provider on when to return to have this removed.   Monitor your Pacemaker site for redness, swelling, and drainage. Call the device clinic at [phone] if you experience these symptoms or fever/chills.  If your incision is sealed with Steri-strips or staples, you may shower 7 days after your procedure or when told by your provider. Do not remove the steri-strips or let the shower hit directly on your site. You may wash around your site with soap and water.    If you were discharged in a sling, please do not wear this during the day more than 48 hours after your surgery unless otherwise instructed. This may increase the risk of stiffness and soreness in your shoulder.   Avoid lotions, ointments, or perfumes over your incision until it is  well-healed.  You may use a hot tub or a pool AFTER your wound check appointment if the incision is completely closed.  Pacemaker Alerts:  Some alerts are vibratory and others beep. These are NOT emergencies. Please call our office to let us know. If this occurs at night or on weekends, it can wait until the next business day. Send a remote transmission.  If your device is capable of reading fluid status (for heart failure), you will be offered monthly monitoring to review this with you.   DEVICE MANAGEMENT Remote monitoring is used to monitor your pacemaker from home. This monitoring is scheduled every 91 days by our office. It allows us to keep an eye on the functioning of your device to ensure it is working properly. You will routinely see your Electrophysiologist annually (more often if necessary).   You should receive your ID card for your new device in 4-8 weeks. Keep this card with you at all times once received. Consider wearing a medical alert bracelet or necklace.  Your Pacemaker may be MRI compatible. This will be discussed at your next office visit/wound check.  You should avoid contact with strong electric or magnetic fields.   Do not use amateur (ham) radio equipment or electric (arc) welding torches. MP3 player headphones with magnets should not be used. Some devices are safe to use if held at least 12 inches (30 cm) from your Pacemaker. These include power tools, lawn   mowers, and speakers. If you are unsure if something is safe to use, ask your health care provider.  When using your cell phone, hold it to the ear that is on the opposite side from the Pacemaker. Do not leave your cell phone in a pocket over the Pacemaker.  You may safely use electric blankets, heating pads, computers, and microwave ovens.  Call the office right away if: You have chest pain. You feel more short of breath than you have felt before. You feel more light-headed than you have felt before. Your  incision starts to open up.  This information is not intended to replace advice given to you by your health care provider. Make sure you discuss any questions you have with your health care provider.  

## 2022-07-01 ENCOUNTER — Telehealth: Payer: Self-pay

## 2022-07-01 ENCOUNTER — Ambulatory Visit: Payer: Medicare Other | Attending: Internal Medicine

## 2022-07-01 DIAGNOSIS — I442 Atrioventricular block, complete: Secondary | ICD-10-CM | POA: Diagnosis present

## 2022-07-01 LAB — CUP PACEART INCLINIC DEVICE CHECK
Battery Remaining Longevity: 179 mo
Battery Voltage: 3.22 V
Brady Statistic AP VP Percent: 0.55 %
Brady Statistic AP VS Percent: 1.52 %
Brady Statistic AS VP Percent: 2.93 %
Brady Statistic AS VS Percent: 94.99 %
Brady Statistic RA Percent Paced: 1.87 %
Brady Statistic RV Percent Paced: 3.48 %
Date Time Interrogation Session: 20240619201251
Implantable Lead Connection Status: 753985
Implantable Lead Connection Status: 753985
Implantable Lead Implant Date: 20240604
Implantable Lead Implant Date: 20240604
Implantable Lead Location: 753859
Implantable Lead Location: 753860
Implantable Lead Model: 3830
Implantable Lead Model: 5076
Implantable Pulse Generator Implant Date: 20240604
Lead Channel Impedance Value: 342 Ohm
Lead Channel Impedance Value: 418 Ohm
Lead Channel Impedance Value: 551 Ohm
Lead Channel Impedance Value: 646 Ohm
Lead Channel Pacing Threshold Amplitude: 0.25 V
Lead Channel Pacing Threshold Amplitude: 0.75 V
Lead Channel Pacing Threshold Amplitude: 0.75 V
Lead Channel Pacing Threshold Pulse Width: 0.4 ms
Lead Channel Pacing Threshold Pulse Width: 0.4 ms
Lead Channel Pacing Threshold Pulse Width: 0.4 ms
Lead Channel Sensing Intrinsic Amplitude: 10.75 mV
Lead Channel Sensing Intrinsic Amplitude: 12 mV
Lead Channel Sensing Intrinsic Amplitude: 6.625 mV
Lead Channel Sensing Intrinsic Amplitude: 7.75 mV
Lead Channel Setting Pacing Amplitude: 3.5 V
Lead Channel Setting Pacing Amplitude: 3.5 V
Lead Channel Setting Pacing Pulse Width: 0.4 ms
Lead Channel Setting Sensing Sensitivity: 0.9 mV
Zone Setting Status: 755011

## 2022-07-01 NOTE — Patient Instructions (Signed)
   After Your Pacemaker   Monitor your pacemaker site for redness, swelling, and drainage. Call the device clinic at [phone] if you experience these symptoms or fever/chills.  Your incision was closed with Steri-strips or staples:  You may shower 7 days after your procedure and wash your incision with soap and water. Avoid lotions, ointments, or perfumes over your incision until it is well-healed.  You may use a hot tub or a pool after your wound check appointment if the incision is completely closed.  Do not lift, push or pull greater than 10 pounds with the affected arm until 6 weeks after your procedure. UNTIL AFTER JULY 16TH. There are no other restrictions in arm movement after your wound check appointment.  You may drive, unless driving has been restricted by your healthcare providers.   Remote monitoring is used to monitor your pacemaker from home. This monitoring is scheduled every 91 days by our office. It allows [place] to keep an eye on the functioning of your device to ensure it is working properly. You will routinely see your Electrophysiologist annually (more often if necessary).

## 2022-07-01 NOTE — Telephone Encounter (Signed)
Patient in today for 14 day wound check post dual chamber PPM implant on 06/16/22.  Full report exported in EPIC.  Normal device function.  Wound healed WNL.   I wanted to flag that patient did present with elevated heart rates on histograms. (Appears 50% of time HR >100bpm.   5 fast A/V events appear SVT according to EGMS.  Longest 3 minutes, rates 150-160's.   He is asymptomatic and doing well.  He is set up with remote monitoring.  Also reports elevated BP readings 150's/98.  He will follow up with gen card Dr. Tenny Craw regarding bp.

## 2022-07-01 NOTE — Progress Notes (Signed)
Wound check appointment. Steri-strips removed. Wound without redness or edema. Incision edges approximated, wound well healed. Normal device function. Thresholds, sensing, and impedances consistent with implant measurements. Device programmed at 3.5V/auto capture programmed on for extra safety margin until 3 month visit. Histogram distribution appropriate for patient and level of activity, there are some noted elevated rates 100-120bpm.  See phone note in EPIC.  5 FAST A/V events recorded, EGM's appear SVT, longest episode is 3 minutes, rates 150-160's.  Patient educated about wound care, arm mobility, lifting restrictions. ROV in 3 months with implanting physician.  Remote monitoring reviewed and set up with [facility] assistance.

## 2022-07-11 ENCOUNTER — Ambulatory Visit (HOSPITAL_COMMUNITY)
Admission: EM | Admit: 2022-07-11 | Discharge: 2022-07-11 | Disposition: A | Discharge status: Home / Self Care | Payer: No Typology Code available for payment source | Attending: Physician Assistant | Admitting: Physician Assistant

## 2022-07-11 ENCOUNTER — Other Ambulatory Visit: Payer: Self-pay

## 2022-07-11 ENCOUNTER — Encounter (HOSPITAL_COMMUNITY): Payer: Self-pay | Admitting: Emergency Medicine

## 2022-07-11 DIAGNOSIS — R220 Localized swelling, mass and lump, head: Secondary | ICD-10-CM | POA: Diagnosis not present

## 2022-07-11 DIAGNOSIS — K0889 Other specified disorders of teeth and supporting structures: Secondary | ICD-10-CM | POA: Diagnosis not present

## 2022-07-11 DIAGNOSIS — K047 Periapical abscess without sinus: Secondary | ICD-10-CM

## 2022-07-11 MED ORDER — CLINDAMYCIN HCL 300 MG PO CAPS: 300.0000 mg | ORAL_CAPSULE | Freq: Three times a day (TID) | ORAL | 0 refills | Status: AC

## 2022-07-11 NOTE — ED Provider Notes (Signed)
[facility]    CSN: [id number] Arrival date & time: 07/11/22  1503      History   Chief Complaint Chief Complaint  Patient presents with   Dental Pain    HPI Lance Mills is a 62 y.o. male.   Patient presents today with 3-day history of increasing pain and swelling around his right lower molars.  He reports that yesterday pain became severe and was rated 10 on a 0-10 pain scale, described as throbbing, worse with mastication or trying to close his jaw, no alleviating factors identified.  He did gargle with warm salt water and hydrogen peroxide which provided some improvement of symptoms.  Currently pain is rated 8 on a 0-10 pain scale.  He denies any swelling with throat, shortness of breath, muffled voice, nausea, vomiting, fever.  Denies any recent antibiotics.  He has not taken any over-the-counter medication for symptom management.  He has not seen a dentist recently denies any recent dental procedures; does have a dentist through the [place] that he sees regularly.    Past Medical History:  Diagnosis Date   HTN (hypertension)    b/p meds stopped May 2013 "low B/P"    Patient Active Problem List   Diagnosis Date Noted   Heart block 06/12/2022   Family history of coronary artery disease 09/26/2011   Chest pain, elevated CK, negative Troponin 09/23/2011   Hypertension 09/23/2011    Past Surgical History:  Procedure Laterality Date   KNEE SURGERY  1997   Lt TKR   PACEMAKER IMPLANT N/A 06/16/2022   Procedure: PACEMAKER IMPLANT;  Surgeon: Regan Lemming, MD;  [facility];  Service: Cardiovascular;  Laterality: N/A;       Home Medications    Prior to Admission medications   Medication Sig Start Date End Date Taking? Authorizing Provider  clindamycin (CLEOCIN) 300 MG capsule Take 1 capsule (300 mg total) by mouth 3 (three) times daily. 07/11/22  Yes Saraih Lorton, Noberto Retort, PA-C  acetaminophen (TYLENOL) 325 MG tablet Take 2 tablets (650 mg total)  by mouth every 4 (four) hours as needed for headache or mild pain. 06/17/22   Graciella Freer, PA-C  ACIDOPHILUS LACTOBACILLUS PO Take 1 tablet by mouth daily. Patient not taking: Reported on 06/13/2022    [provider]  amLODipine (NORVASC) 10 MG tablet Take 1 tablet (10 mg total) by mouth daily. 06/18/22   Graciella Freer, PA-C  atorvastatin (LIPITOR) 40 MG tablet Take 40 mg by mouth at bedtime.    [provider]  Cholecalciferol (VITAMIN D-3 PO) Take 2,000 Units by mouth daily.    [provider]  colestipol (COLESTID) 1 g tablet Take 1 g by mouth as needed. Pt states he does not take medication daily as prescribed  states he only takes them PRN in order to avoid having constipation    [provider]  diphenoxylate-atropine (LOMOTIL) 2.5-0.025 MG tablet Take 1 tablet by mouth 4 (four) times daily as needed for diarrhea or loose stools. PRN    [provider]  EPINEPHrine (EPIPEN 2-PAK) 0.3 mg/0.3 mL IJ SOAJ injection Inject 0.3 mg into the muscle as needed for anaphylaxis. PRN Patient not taking: Reported on 06/13/2022    [provider]  furosemide (LASIX) 20 MG tablet Take 10 mg by mouth daily.    [provider]  hydrALAZINE (APRESOLINE) 25 MG tablet Take 1 tablet (25 mg total) by mouth every 8 (eight) hours. 06/17/22   Tillery,  Mariam Dollar, PA-C  losartan (COZAAR) 100 MG tablet Take 100 mg by mouth daily.    [provider]  sildenafil (VIAGRA) 100 MG tablet Take 100 mg by mouth daily as needed for erectile dysfunction. Take 1 tablet by mouth as instructed *Take 1 hour prior to sexual activity, do not exceed 1 dose per 24 hours*    [provider]  Skin Protectants, Misc. (EUCERIN) cream Apply 1 Application topically as needed for dry skin. Apply a thin layer to affected area daily as needed for dry skin.    [provider]    Family History Family History  Problem Relation Age of Onset    Coronary artery disease Father 45       Several MIs    Social History Social History   Tobacco Use   Smoking status: Never  Vaping Use   Vaping Use: Never used  Substance Use Topics   Alcohol use: Not Currently   Drug use: Not Currently    Types: Marijuana     Allergies   Amoxicillin, Aspirin, Atenolol, Bee venom, Biaxin [clarithromycin], Coconut (cocos nucifera), Flomax [tamsulosin], Nitroglycerin er, Penicillins, Prazosin, Procaine, Vioxx [rofecoxib], Gabapentin, Midazolam, Other, Pregabalin, Toradol [ketorolac tromethamine], Egg-derived products, Lortab [hydrocodone-acetaminophen], Morphine, and Tylenol with codeine #3 [acetaminophen-codeine]   Review of Systems Review of Systems  Constitutional:  Positive for activity change. Negative for appetite change, fatigue and fever.  HENT:  Positive for dental problem and facial swelling. Negative for congestion, sore throat, trouble swallowing and voice change.   Respiratory:  Negative for cough and shortness of breath.   Cardiovascular:  Negative for chest pain.  Gastrointestinal:  Negative for abdominal pain, diarrhea, nausea and vomiting.     Physical Exam Triage Vital Signs ED Triage Vitals  Enc Vitals Group     BP 07/11/22 1526 129/88     Pulse Rate 07/11/22 1526 84     Resp 07/11/22 1526 20     Temp 07/11/22 1526 99 F (37.2 C)     Temp Source 07/11/22 1526 Oral     SpO2 07/11/22 1526 96 %     Weight --      Height --      Head Circumference --      Peak Flow --      Pain Score 07/11/22 1524 8     Pain Loc --      Pain Edu? --      Excl. in GC? --    No data found.  Updated Vital Signs BP 129/88 (BP Location: Left Arm) Comment (BP Location): large cuff  Pulse 84   Temp 99 F (37.2 C) (Oral)   Resp 20   SpO2 96%   Visual Acuity Right Eye Distance:   Left Eye Distance:   Bilateral Distance:    Right Eye Near:   Left Eye Near:    Bilateral Near:     Physical Exam Vitals reviewed.   Constitutional:      General: He is awake.     Appearance: Normal appearance. He is well-developed. He is not ill-appearing.     Comments: Very pleasant male appears stated age in no acute distress sitting comfortably in exam room  HENT:     Head: Normocephalic and atraumatic.     Right Ear: Tympanic membrane, ear canal and external ear normal. Tympanic membrane is not erythematous or bulging.     Left Ear: Tympanic membrane, ear canal and external ear normal. Tympanic membrane is not erythematous  or bulging.     Nose: Nose normal.     Mouth/Throat:     Mouth: Mucous membranes are moist.     Dentition: Gingival swelling and dental abscesses present. No gum lesions.     Pharynx: Uvula midline. No oropharyngeal exudate or posterior oropharyngeal erythema.      Comments: No evidence of Ludwig angina Cardiovascular:     Rate and Rhythm: Normal rate and regular rhythm.     Heart sounds: Normal heart sounds, S1 normal and S2 normal. No murmur heard. Pulmonary:     Effort: Pulmonary effort is normal. No accessory muscle usage or respiratory distress.     Breath sounds: Normal breath sounds. No stridor. No wheezing, rhonchi or rales.     Comments: Clear to auscultation bilaterally Lymphadenopathy:     Head:     Right side of head: No submental, submandibular or tonsillar adenopathy.     Left side of head: No submental, submandibular or tonsillar adenopathy.     Cervical: No cervical adenopathy.  Neurological:     Mental Status: He is alert.  Psychiatric:        Behavior: Behavior is cooperative.      UC Treatments / Results  Labs (all labs ordered are listed, but only abnormal results are displayed) Labs Reviewed - No data to display  EKG   Radiology No results found.  Procedures Procedures (including critical care time)  Medications Ordered in UC Medications - No data to display  Initial Impression / Assessment and Plan / UC Course  I have reviewed the triage vital  signs and the nursing notes.  Pertinent labs & imaging results that were available during my care of the patient were reviewed by me and considered in my medical decision making (see chart for details).     Patient is well-appearing, afebrile, nontoxic, nontachycardic.  No indication for emergent evaluation or imaging.  Given penicillin allergy was started on clindamycin 3 times daily.  Discussed that if develops any GI symptoms including significant diarrhea he needs to stop medication to be seen immediately.  He can use over-the-counter analgesics as needed.  Recommend he gargle with warm salt water for additional symptom relief.  Discussed that ultimately he will need to see dentist to address underlying tooth.  He was provided low-cost dental resources in the area with after visit summary.  If he develops any worsening symptoms including fever, nausea, vomiting, swelling of his throat, muffled voice, dysphagia he needs to go to the emergency room to which he expressed understanding.    Final Clinical Impressions(s) / UC Diagnoses   Final diagnoses:  Dental infection  Pain, dental  Facial swelling     Discharge Instructions      Start clindamycin 3 times daily.  If you develop any severe diarrhea please stop the medication to be seen immediately.  Gargle with warm salt water for symptom relief.  You should follow-up with dentist; call to schedule an appointment.  If you develop any worsening symptoms including difficulty swallowing, difficulty speaking, swelling of your throat, high fever, change in your voice you need to be seen immediately.      ED Prescriptions     Medication Sig Dispense Auth. Provider   clindamycin (CLEOCIN) 300 MG capsule Take 1 capsule (300 mg total) by mouth 3 (three) times daily. 21 capsule Laquasha Groome K, PA-C      PDMP not reviewed this encounter.   Jeani Hawking, PA-C 07/11/22 1547

## 2022-07-11 NOTE — ED Triage Notes (Signed)
Pain is on bottom, right jaw.  Reports pain and swelling for 3 days.

## 2022-07-11 NOTE — Discharge Instructions (Addendum)
Start clindamycin 3 times daily.  If you develop any severe diarrhea please stop the medication to be seen immediately.  Gargle with warm salt water for symptom relief.  You should follow-up with dentist; call to schedule an appointment.  If you develop any worsening symptoms including difficulty swallowing, difficulty speaking, swelling of your throat, high fever, change in your voice you need to be seen immediately.

## 2022-09-16 ENCOUNTER — Ambulatory Visit (INDEPENDENT_AMBULATORY_CARE_PROVIDER_SITE_OTHER): Payer: No Typology Code available for payment source

## 2022-09-16 DIAGNOSIS — I459 Conduction disorder, unspecified: Secondary | ICD-10-CM | POA: Diagnosis not present

## 2022-09-16 LAB — CUP PACEART REMOTE DEVICE CHECK
Battery Remaining Longevity: 180 mo
Battery Voltage: 3.21 V
Brady Statistic AP VP Percent: 0.37 %
Brady Statistic AP VS Percent: 0.79 %
Brady Statistic AS VP Percent: 4.88 %
Brady Statistic AS VS Percent: 93.96 %
Brady Statistic RA Percent Paced: 1.12 %
Brady Statistic RV Percent Paced: 5.25 %
Date Time Interrogation Session: 20240903211829
Implantable Lead Connection Status: 753985
Implantable Lead Connection Status: 753985
Implantable Lead Implant Date: 20240604
Implantable Lead Implant Date: 20240604
Implantable Lead Location: 753859
Implantable Lead Location: 753860
Implantable Lead Model: 3830
Implantable Lead Model: 5076
Implantable Pulse Generator Implant Date: 20240604
Lead Channel Impedance Value: 304 Ohm
Lead Channel Impedance Value: 380 Ohm
Lead Channel Impedance Value: 494 Ohm
Lead Channel Impedance Value: 513 Ohm
Lead Channel Pacing Threshold Amplitude: 0.5 V
Lead Channel Pacing Threshold Amplitude: 0.75 V
Lead Channel Pacing Threshold Pulse Width: 0.4 ms
Lead Channel Pacing Threshold Pulse Width: 0.4 ms
Lead Channel Sensing Intrinsic Amplitude: 13.25 mV
Lead Channel Sensing Intrinsic Amplitude: 13.25 mV
Lead Channel Sensing Intrinsic Amplitude: 6.875 mV
Lead Channel Sensing Intrinsic Amplitude: 6.875 mV
Lead Channel Setting Pacing Amplitude: 1.5 V
Lead Channel Setting Pacing Amplitude: 2 V
Lead Channel Setting Pacing Pulse Width: 0.4 ms
Lead Channel Setting Sensing Sensitivity: 0.9 mV
Zone Setting Status: 755011

## 2022-09-29 ENCOUNTER — Encounter: Payer: Self-pay | Admitting: Cardiology

## 2022-09-29 ENCOUNTER — Ambulatory Visit: Payer: Medicare Other | Attending: Cardiology | Admitting: Cardiology

## 2022-09-29 VITALS — BP 160/100 | HR 72 | Ht 72.0 in | Wt 220.8 lb

## 2022-09-29 DIAGNOSIS — I442 Atrioventricular block, complete: Secondary | ICD-10-CM | POA: Diagnosis present

## 2022-09-29 DIAGNOSIS — I459 Conduction disorder, unspecified: Secondary | ICD-10-CM | POA: Diagnosis present

## 2022-09-29 NOTE — Progress Notes (Signed)
Electrophysiology Office Note:   Date:  09/29/2022  ID:  Lance Mills, DOB 05/22/1960, MRN [id number]  Primary Cardiologist: None Electrophysiologist: Camdon Saetern Jorja Loa, MD      History of Present Illness:   Lance Mills is a 62 y.o. male with h/o hypertension, hyperlipidemia, intermittent heart block seen today for routine electrophysiology followup.   Since last being seen in our clinic the patient reports doing well.  He has had no further episodes of syncope since the pacemaker was implanted.  He does note that his blood pressure was low yesterday and did not take his blood pressure medications today.  Now his blood pressure is elevated.  he denies chest pain, palpitations, dyspnea, PND, orthopnea, nausea, vomiting, dizziness, syncope, edema, weight gain, or early satiety.   Review of systems complete and found to be negative unless listed in HPI.      EP Information / Studies Reviewed:    EKG is ordered today. Personal review as below.  EKG Interpretation Date/Time:  Tuesday September 29 2022 16:01:44 EDT Ventricular Rate:  72 PR Interval:  314 QRS Duration:  98 QT Interval:  396 QTC Calculation: 433 R Axis:   250  Text Interpretation: Sinus rhythm with 1st degree A-V block Right superior axis deviation Pulmonary disease pattern Incomplete right bundle branch block When compared with ECG of 17-Jun-2022 06:03, VENTRICULAR PACING is no longer Confirmed by Bessye Stith ([id number]) on 09/29/2022 4:12:13 PM   PPM Interrogation-  reviewed in detail today,  See PACEART report.  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  Risk Assessment/Calculations:     Physical Exam:   VS:  BP (!) 160/100 (BP Location: Right Arm, Patient Position: Sitting, Cuff Size: Large)   Pulse 72   Ht 6' (1.829 m)   Wt 220 lb 12.8 oz (100.2 kg)   SpO2 98%   BMI 29.95 kg/m    Wt Readings from Last 3 Encounters:  09/29/22 220 lb 12.8 oz  (100.2 kg)  06/17/22 213 lb 12.8 oz (97 kg)  12/24/21 220 lb 6.4 oz (100 kg)     GEN: Well nourished, well developed in no acute distress NECK: No JVD; No carotid bruits CARDIAC: Regular rate and rhythm, no murmurs, rubs, gallops RESPIRATORY:  Clear to auscultation without rales, wheezing or rhonchi  ABDOMEN: Soft, non-tender, non-distended EXTREMITIES:  No edema; No deformity   ASSESSMENT AND PLAN:    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  Hypertension: Patient has not taken his medications today.  Blood pressure significantly elevated.  Advised the patient to be compliant with his antihypertensives.  Damita Eppard stop hydralazine his blood pressure was low yesterday.  3.  Hyperlipidemia: Continue atorvastatin per primary physician  Disposition:   Follow up with Dr. Elberta Fortis in 12 months  Signed, Harman Langhans Jorja Loa, MD

## 2022-09-29 NOTE — Patient Instructions (Signed)
Medication Instructions:  Your physician has recommended you make the following change in your medication: STOP Hydralazine  *If you need a refill on your cardiac medications before your next appointment, please call your pharmacy*   Lab Work: None ordered   Testing/Procedures: None ordered   Follow-Up: At [facility], you and your health needs are our priority.  As part of our continuing mission to provide you with exceptional heart care, we have created designated Provider Care Teams.  These Care Teams include your primary Cardiologist (physician) and Advanced Practice Providers (APPs -  Physician Assistants and Nurse Practitioners) who all work together to provide you with the care you need, when you need it.  We recommend signing up for the patient portal called "MyChart".  Sign up information is provided on this After Visit Summary.  MyChart is used to connect with patients for Virtual Visits (Telemedicine).  Patients are able to view lab/test results, encounter notes, upcoming appointments, etc.  Non-urgent messages can be sent to your provider as well.   To learn more about what you can do with MyChart, go to [email or web address].    Your next appointment:   As   needed  The format for your next appointment:   In Person  Provider:   Loman Brooklyn, MD  We will be happy to send over your records when you establish with cardiology in [place].    Good luck and safe travels   Thank you for choosing [facility]!!   Dory Horn, RN [phone]

## 2022-09-29 NOTE — Progress Notes (Signed)
Remote pacemaker transmission.   

## 2022-10-09 LAB — CUP PACEART INCLINIC DEVICE CHECK
Date Time Interrogation Session: 20240917131836
Implantable Lead Connection Status: 753985
Implantable Lead Connection Status: 753985
Implantable Lead Implant Date: 20240604
Implantable Lead Implant Date: 20240604
Implantable Lead Location: 753859
Implantable Lead Location: 753860
Implantable Lead Model: 3830
Implantable Lead Model: 5076
Implantable Pulse Generator Implant Date: 20240604

## 2022-10-10 ENCOUNTER — Other Ambulatory Visit: Payer: Self-pay

## 2022-10-10 ENCOUNTER — Emergency Department (HOSPITAL_COMMUNITY)
Admission: EM | Admit: 2022-10-10 | Discharge: 2022-10-11 | Disposition: A | Discharge status: Home / Self Care | Payer: No Typology Code available for payment source | Attending: Emergency Medicine | Admitting: Emergency Medicine

## 2022-10-10 DIAGNOSIS — I10 Essential (primary) hypertension: Secondary | ICD-10-CM | POA: Diagnosis not present

## 2022-10-10 DIAGNOSIS — R42 Dizziness and giddiness: Secondary | ICD-10-CM | POA: Diagnosis present

## 2022-10-10 DIAGNOSIS — Z950 Presence of cardiac pacemaker: Secondary | ICD-10-CM | POA: Diagnosis not present

## 2022-10-10 DIAGNOSIS — Z79899 Other long term (current) drug therapy: Secondary | ICD-10-CM | POA: Diagnosis not present

## 2022-10-10 LAB — CBC WITH DIFFERENTIAL/PLATELET
Abs Immature Granulocytes: 0.04 10*3/uL (ref 0.00–0.07)
Basophils Absolute: 0.1 10*3/uL (ref 0.0–0.1)
Basophils Relative: 1 %
Eosinophils Absolute: 0.2 10*3/uL (ref 0.0–0.5)
Eosinophils Relative: 3 %
HCT: 46.3 % (ref 39.0–52.0)
Hemoglobin: 14.6 g/dL (ref 13.0–17.0)
Immature Granulocytes: 1 %
Lymphocytes Relative: 39 %
Lymphs Abs: 2.1 10*3/uL (ref 0.7–4.0)
MCH: 27.8 pg (ref 26.0–34.0)
MCHC: 31.5 g/dL (ref 30.0–36.0)
MCV: 88.2 fL (ref 80.0–100.0)
Monocytes Absolute: 0.7 10*3/uL (ref 0.1–1.0)
Monocytes Relative: 12 %
Neutro Abs: 2.4 10*3/uL (ref 1.7–7.7)
Neutrophils Relative %: 44 %
Platelets: 305 10*3/uL (ref 150–400)
RBC: 5.25 MIL/uL (ref 4.22–5.81)
RDW: 13.7 % (ref 11.5–15.5)
WBC: 5.4 10*3/uL (ref 4.0–10.5)
nRBC: 0 % (ref 0.0–0.2)

## 2022-10-10 LAB — COMPREHENSIVE METABOLIC PANEL
ALT: 38 U/L (ref 0–44)
AST: 38 U/L (ref 15–41)
Albumin: 3.8 g/dL (ref 3.5–5.0)
Alkaline Phosphatase: 62 U/L (ref 38–126)
Anion gap: 17 — ABNORMAL HIGH (ref 5–15)
BUN: 14 mg/dL (ref 8–23)
CO2: 20 mmol/L — ABNORMAL LOW (ref 22–32)
Calcium: 9.6 mg/dL (ref 8.9–10.3)
Chloride: 102 mmol/L (ref 98–111)
Creatinine, Ser: 1.5 mg/dL — ABNORMAL HIGH (ref 0.61–1.24)
GFR, Estimated: 52 mL/min — ABNORMAL LOW (ref >60)
Glucose, Bld: 98 mg/dL (ref 70–99)
Potassium: 3.7 mmol/L (ref 3.5–5.1)
Sodium: 139 mmol/L (ref 135–145)
Total Bilirubin: 0.5 mg/dL (ref 0.3–1.2)
Total Protein: 7 g/dL (ref 6.5–8.1)

## 2022-10-10 NOTE — ED Triage Notes (Signed)
Patient reports feeling lightheaded /dizzy this afternoon , denies pain/respirations unlabored ,alert and oriented . BP= 91/65 at home this evening .

## 2022-10-11 ENCOUNTER — Other Ambulatory Visit: Payer: Self-pay

## 2022-10-11 LAB — D-DIMER, QUANTITATIVE: D-Dimer, Quant: 0.31 ug{FEU}/mL (ref 0.00–0.50)

## 2022-10-11 MED ORDER — SODIUM CHLORIDE 0.9 % IV BOLUS
500.0000 mL | Freq: Once | INTRAVENOUS | Status: AC
  Administered 2022-10-11: 500 mL via INTRAVENOUS

## 2022-10-11 NOTE — ED Provider Notes (Signed)
[place] [facility] Provider Note   CSN: [id number] Arrival date & time: 10/10/22  1909     History  Chief Complaint  Patient presents with   Lightheaded/Dizzy    Lance Mills is a 62 y.o. male.  The history is provided by the patient and medical records.  Lance Mills is a 62 y.o. male who presents to the Emergency Department complaining of dizziness.  He presents to the emergency department for evaluation of dizziness and lightheadedness that started this afternoon.  His first episode started while he was driving.  He checked his blood pressure and it was low for him, in the 90s systolic.  He got better and then later while he was doing some activity he felt dizzy again and his blood pressure was also in the 90s when he checked it.  At time of ED arrival he is asymptomatic.  He did have a slight headache while he was in the waiting room.  No current headache. No chest pain, AP, sob, cough, N/V/D, hematochezia, melena, leg swelling.   Has pacemaker placed in May for sinus pause.  Medtronic.  Spinal cord stimulator  Hx/o HTN.  Taking only losartan since 9/17. Bp has been lower than previously.      Home Medications Prior to Admission medications   Medication Sig Start Date End Date Taking? Authorizing Provider  acetaminophen (TYLENOL) 325 MG tablet Take 2 tablets (650 mg total) by mouth every 4 (four) hours as needed for headache or mild pain. 06/17/22   Graciella Freer, PA-C  ACIDOPHILUS LACTOBACILLUS PO Take 1 tablet by mouth daily.    [provider]  amLODipine (NORVASC) 10 MG tablet Take 1 tablet (10 mg total) by mouth daily. 06/18/22   Graciella Freer, PA-C  atorvastatin (LIPITOR) 40 MG tablet Take 40 mg by mouth at bedtime.    [provider]  Cholecalciferol (VITAMIN D-3 PO) Take 2,000 Units by mouth daily.    [provider]  clindamycin (CLEOCIN) 300 MG capsule Take 1 capsule (300 mg total)  by mouth 3 (three) times daily. Patient not taking: Reported on 09/29/2022 07/11/22   Raspet, Noberto Retort, PA-C  colestipol (COLESTID) 1 g tablet Take 1 g by mouth as needed. Pt states he does not take medication daily as prescribed  states he only takes them PRN in order to avoid having constipation    [provider]  diphenoxylate-atropine (LOMOTIL) 2.5-0.025 MG tablet Take 1 tablet by mouth 4 (four) times daily as needed for diarrhea or loose stools. PRN    [provider]  EPINEPHrine (EPIPEN 2-PAK) 0.3 mg/0.3 mL IJ SOAJ injection Inject 0.3 mg into the muscle as needed for anaphylaxis. PRN    [provider]  furosemide (LASIX) 20 MG tablet Take 10 mg by mouth daily.    [provider]  losartan (COZAAR) 100 MG tablet Take 100 mg by mouth daily.    [provider]  sildenafil (VIAGRA) 100 MG tablet Take 100 mg by mouth daily as needed for erectile dysfunction. Take 1 tablet by mouth as instructed *Take 1 hour prior to sexual activity, do not exceed 1 dose per 24 hours*    [provider]  Skin Protectants, Misc. (EUCERIN) cream Apply 1 Application topically as needed for dry skin. Apply a thin layer to affected area daily as needed for dry skin.    [provider]      Allergies    Amoxicillin,  Aspirin, Atenolol, Bee venom, Biaxin [clarithromycin], Coconut (cocos nucifera), Flomax [tamsulosin], Nitroglycerin er, Penicillins, Prazosin, Procaine, Vioxx [rofecoxib], Gabapentin, Midazolam, Other, Pregabalin, Toradol [ketorolac tromethamine], Egg-derived products, Lortab [hydrocodone-acetaminophen], Morphine, and Tylenol with codeine #3 [acetaminophen-codeine]    Review of Systems   Review of Systems  All other systems reviewed and are negative.   Physical Exam Updated Vital Signs BP (!) 169/92 (BP Location: Right Arm)   Pulse 65   Temp 98 F (36.7 C) (Oral)   Resp 19   SpO2 100%  Physical Exam Vitals and nursing note reviewed.   Constitutional:      Appearance: He is well-developed.  HENT:     Head: Normocephalic and atraumatic.  Cardiovascular:     Rate and Rhythm: Normal rate and regular rhythm.     Heart sounds: No murmur heard. Pulmonary:     Effort: Pulmonary effort is normal. No respiratory distress.     Breath sounds: Normal breath sounds.  Abdominal:     Palpations: Abdomen is soft.     Tenderness: There is no abdominal tenderness. There is no guarding or rebound.  Musculoskeletal:        General: No swelling or tenderness.  Skin:    General: Skin is warm and dry.  Neurological:     Mental Status: He is alert and oriented to person, place, and time.     Comments: 5 out of 5 strength in all 4 extremities with sensation to light touch intact in all 4 extremities  Psychiatric:        Behavior: Behavior normal.     ED Results / Procedures / Treatments   Labs (all labs ordered are listed, but only abnormal results are displayed) Labs Reviewed  COMPREHENSIVE METABOLIC PANEL - Abnormal; Notable for the following components:      Result Value   CO2 20 (*)    Creatinine, Ser 1.50 (*)    GFR, Estimated 52 (*)    Anion gap 17 (*)    All other components within normal limits  CBC WITH DIFFERENTIAL/PLATELET  D-DIMER, QUANTITATIVE  URINALYSIS, ROUTINE W REFLEX MICROSCOPIC    EKG None  Radiology No results found.  Procedures Procedures    Medications Ordered in ED Medications  sodium chloride 0.9 % bolus 500 mL (0 mLs Intravenous Stopped 10/11/22 0428)    ED Course/ Medical Decision Making/ A&P                                 Medical Decision Making Amount and/or Complexity of Data Reviewed Labs: ordered.   Patient with history of hypertension, sick sinus syndrome status post pacemaker placement here for evaluation of feeling lightheaded, checked blood pressures at home that were in the 90s.  Patient is normally hypertensive in the emergency department.  His device was interrogated,  he did have 1 episode of SVT on September 22, no episodes of events today and his device is appropriately working.  BMP with mild elevation in his creatinine when compared to priors.  He is well-perfused and tolerating p.o.  He was treated with some gentle fluid hydration.  He has no urinary symptoms.  Hemoglobin is at his baseline.  Doubt PE, he is low risk and has no chest pain.  D-dimer is negative.  Patient has been asymptomatic throughout his ED stay.  Discussed with patient home care for lightheaded episode, now resolved.  Discussed outpatient follow-up and return precautions.  Final Clinical Impression(s) / ED Diagnoses Final diagnoses:  Dizzy spells    Rx / DC Orders ED Discharge Orders     None         Tilden Fossa, MD 10/11/22 [phone]

## 2022-10-11 NOTE — ED Notes (Signed)
ED Provider back at bedside. 

## 2022-12-16 ENCOUNTER — Ambulatory Visit (INDEPENDENT_AMBULATORY_CARE_PROVIDER_SITE_OTHER): Payer: No Typology Code available for payment source

## 2022-12-16 DIAGNOSIS — I459 Conduction disorder, unspecified: Secondary | ICD-10-CM | POA: Diagnosis not present

## 2022-12-16 LAB — CUP PACEART REMOTE DEVICE CHECK
Battery Remaining Longevity: 177 mo
Battery Voltage: 3.18 V
Brady Statistic AP VP Percent: 0.33 %
Brady Statistic AP VS Percent: 0.53 %
Brady Statistic AS VP Percent: 5.34 %
Brady Statistic AS VS Percent: 93.8 %
Brady Statistic RA Percent Paced: 0.83 %
Brady Statistic RV Percent Paced: 5.67 %
Date Time Interrogation Session: 20241204022141
Implantable Lead Connection Status: 753985
Implantable Lead Connection Status: 753985
Implantable Lead Implant Date: 20240604
Implantable Lead Implant Date: 20240604
Implantable Lead Location: 753859
Implantable Lead Location: 753860
Implantable Lead Model: 3830
Implantable Lead Model: 5076
Implantable Pulse Generator Implant Date: 20240604
Lead Channel Impedance Value: 361 Ohm
Lead Channel Impedance Value: 418 Ohm
Lead Channel Impedance Value: 456 Ohm
Lead Channel Impedance Value: 551 Ohm
Lead Channel Pacing Threshold Amplitude: 0.5 V
Lead Channel Pacing Threshold Amplitude: 0.625 V
Lead Channel Pacing Threshold Pulse Width: 0.4 ms
Lead Channel Pacing Threshold Pulse Width: 0.4 ms
Lead Channel Sensing Intrinsic Amplitude: 12.375 mV
Lead Channel Sensing Intrinsic Amplitude: 12.375 mV
Lead Channel Sensing Intrinsic Amplitude: 8 mV
Lead Channel Sensing Intrinsic Amplitude: 8 mV
Lead Channel Setting Pacing Amplitude: 1.5 V
Lead Channel Setting Pacing Amplitude: 2 V
Lead Channel Setting Pacing Pulse Width: 0.4 ms
Lead Channel Setting Sensing Sensitivity: 0.9 mV
Zone Setting Status: 755011
Zone Setting Status: 755011

## 2023-08-23 ENCOUNTER — Telehealth: Payer: Self-pay | Admitting: Cardiology

## 2023-08-23 NOTE — Telephone Encounter (Signed)
 Can you help pt with options for monitoring.  Thanks!

## 2023-08-23 NOTE — Telephone Encounter (Signed)
 Maryjo is requesting a callback regarding her stating the pt has contacted her stating he's incarcerated but would like to find out what to do about his pace maker alerts and readings since he's not allowed to keep his phone. Please advise

## 2023-08-24 NOTE — Telephone Encounter (Signed)
 Pt is not in our system and [facility]

## 2023-11-08 ENCOUNTER — Ambulatory Visit: Attending: Cardiology | Admitting: Cardiology

## 2023-11-08 ENCOUNTER — Ambulatory Visit: Payer: Self-pay | Admitting: Cardiology

## 2023-11-08 ENCOUNTER — Encounter: Payer: Self-pay | Admitting: Cardiology

## 2023-11-08 VITALS — BP 120/86 | HR 93 | Ht 72.0 in | Wt 203.0 lb

## 2023-11-08 DIAGNOSIS — I442 Atrioventricular block, complete: Secondary | ICD-10-CM | POA: Diagnosis not present

## 2023-11-08 DIAGNOSIS — I10 Essential (primary) hypertension: Secondary | ICD-10-CM | POA: Diagnosis not present

## 2023-11-08 LAB — CUP PACEART INCLINIC DEVICE CHECK
Brady Statistic RA Percent Paced: 2.6 %
Brady Statistic RV Percent Paced: 40.1 %
Date Time Interrogation Session: 20251027165335
Implantable Lead Connection Status: 753985
Implantable Lead Connection Status: 753985
Implantable Lead Implant Date: 20240604
Implantable Lead Implant Date: 20240604
Implantable Lead Location: 753859
Implantable Lead Location: 753860
Implantable Lead Model: 3830
Implantable Lead Model: 5076
Implantable Pulse Generator Implant Date: 20240604
Lead Channel Impedance Value: 418 Ohm
Lead Channel Impedance Value: 532 Ohm
Lead Channel Pacing Threshold Amplitude: 0.5 V
Lead Channel Pacing Threshold Amplitude: 0.75 V
Lead Channel Pacing Threshold Pulse Width: 0.4 ms
Lead Channel Pacing Threshold Pulse Width: 0.4 ms
Lead Channel Sensing Intrinsic Amplitude: 4.6 mV
Lead Channel Sensing Intrinsic Amplitude: 6.5 mV

## 2023-11-08 NOTE — Progress Notes (Signed)
  Electrophysiology Office Note:   Date:  11/08/2023  ID:  Lance Mills, DOB September 15, 1960, MRN [id number]  Primary Cardiologist: None Primary Heart Failure: None Electrophysiologist: Trequan Marsolek Gladis Norton, MD       History of Present Illness:   Lance Mills is a 63 y.o. male with h/o hypertension, hyperlipidemia, intermittent heart block seen today for routine electrophysiology followup.   Since last being seen in our clinic the patient reports doing overall well.  He has no chest pain or shortness of breath.  He does have some dizziness.  He has no abnormalities on his pacemaker.  He is dizzy when he is at rest and does not associated necessarily with changing position or movement.  he denies chest pain, palpitations, dyspnea, PND, orthopnea, nausea, vomiting, dizziness, syncope, edema, weight gain, or early satiety.   Review of systems complete and found to be negative unless listed in HPI.      EP Information / Studies Reviewed:    EKG is ordered today. Personal review as below.  EKG Interpretation Date/Time:  Monday November 08 2023 14:38:37 EDT Ventricular Rate:  93 PR Interval:  266 QRS Duration:  86 QT Interval:  342 QTC Calculation: 425 R Axis:   -74  Text Interpretation: Sinus rhythm with 1st degree A-V block Left axis deviation When compared with ECG of 11-Oct-2022 03:37, VENTRICULAR PACING is no longer Confirmed by Jazen Spraggins ([id number]) on 11/08/2023 2:41:12 PM   PPM Interrogation-  reviewed in detail today,  See PACEART report.  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 for intermittent complete heart block  Risk Assessment/Calculations:           Physical Exam:   VS:  BP 120/86 (BP Location: Left Arm, Patient Position: Sitting, Cuff Size: Large)   Pulse 93   Ht 6' (1.829 m)   Wt 203 lb (92.1 kg)   SpO2 100%   BMI 27.53 kg/m    Wt Readings from Last 3 Encounters:  11/08/23 203 lb (92.1 kg)  09/29/22 220 lb 12.8 oz (100.2 kg)   06/17/22 213 lb 12.8 oz (97 kg)     GEN: Well nourished, well developed in no acute distress NECK: No JVD; No carotid bruits CARDIAC: Regular rate and rhythm, no murmurs, rubs, gallops RESPIRATORY:  Clear to auscultation without rales, wheezing or rhonchi  ABDOMEN: Soft, non-tender, non-distended EXTREMITIES:  No edema; No deformity   ASSESSMENT AND PLAN:    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  Hypertension:well controlled  3.  Hyperlipidemia: Continue atorvastatin  per primary physician   Disposition:   Follow up with EP Team 2 years   Signed, Earnie Rockhold Gladis Norton, MD

## 2024-02-13 DEATH — deceased
